# Patient Record
Sex: Female | Born: 1995
Health system: Southern US, Community
[De-identification: ages and names within clinical notes are randomized; demographics above are authoritative.]

## PROBLEM LIST (undated history)

## (undated) DIAGNOSIS — L709 Acne, unspecified: Secondary | ICD-10-CM

## (undated) DIAGNOSIS — R51 Headache: Secondary | ICD-10-CM

## (undated) DIAGNOSIS — E669 Obesity, unspecified: Secondary | ICD-10-CM

## (undated) DIAGNOSIS — F419 Anxiety disorder, unspecified: Secondary | ICD-10-CM

## (undated) HISTORY — DX: Obesity, unspecified: E66.9

## (undated) HISTORY — DX: Acne, unspecified: L70.9

---
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
## 2013-01-12 ENCOUNTER — Ambulatory Visit (INDEPENDENT_AMBULATORY_CARE_PROVIDER_SITE_OTHER): Payer: BC Managed Care – PPO | Admitting: Family Medicine

## 2013-01-12 ENCOUNTER — Encounter: Payer: Self-pay | Admitting: Family Medicine

## 2013-01-12 VITALS — BP 112/80 | HR 72 | Temp 97.9°F | Ht 66.25 in | Wt 198.0 lb

## 2013-01-12 DIAGNOSIS — R079 Chest pain, unspecified: Secondary | ICD-10-CM | POA: Insufficient documentation

## 2013-01-12 DIAGNOSIS — R21 Rash and other nonspecific skin eruption: Secondary | ICD-10-CM

## 2013-01-12 NOTE — Progress Notes (Signed)
  Subjective:    Patient ID: Diane Moss, female    DOB: November 07, 1995, 17 y.o.   MRN: [id number]  HPI  17 yo female here for:  1.  Rash- very itchy, on arms, legs and back x 2 weeks- on and off.  No lesions currently.  Benadryl does help.  No new soaps, detergents, foods or other known substances.    2.  CP- started last week.  No known injury.  Pain is worse with deep inspirations and intermittent.  Does sometimes radiate to her back.  No DOE or diaphoresis.  Patient Active Problem List   Diagnosis Date Noted  . Rash and nonspecific skin eruption 01/12/2013  . Chest pain 01/12/2013  . Abdominal pain, left lateral 06/05/2012  . Viral bronchitis 04/07/2012  . Back pain 03/25/2011  . Pituitary microadenoma 01/08/2011  . Routine general medical examination at a health care facility 01/04/2011  . Amenorrhea 01/04/2011  . CHILDHOOD OBESITY 02/20/2009  . AMENORRHEA, PRIMARY 02/20/2009   Past Medical History  Diagnosis Date  . Acne   . Obesity    No past surgical history on file. History  Substance Use Topics  . Smoking status: Never Smoker   . Smokeless tobacco: Not on file  . Alcohol Use: No   Family History  Problem Relation Age of Onset  . Cancer Mother 30    cervial and ovarian  . Diabetes Other   . Hyperlipidemia Other   . Hypertension Other    No Known Allergies Current Outpatient Prescriptions on File Prior to Visit  Medication Sig Dispense Refill  . NECON 10/11, 28, 35 MCG tablet TAKE 1 TABLET BY MOUTH ONCE A DAY  28 tablet  10   No current facility-administered medications on file prior to visit.   The PMH, PSH, Social History, Family History, Medications, and allergies have been reviewed in [facility], and have been updated if relevant.   Review of Systems See HPI    Objective:   Physical Exam BP 112/80  Pulse 72  Temp(Src) 97.9 F (36.6 C)  Ht 5' 6.25" (1.683 m)  Wt 198 lb (89.812 kg)  BMI 31.71 kg/m2  General:  Well-developed,well-nourished,in no acute  distress; alert,appropriate and cooperative throughout examination Head:  normocephalic and atraumatic.   Lungs:  Normal respiratory effort, chest expands symmetrically. Lungs are clear to auscultation, no crackles or wheezes. Heart:  Normal rate and regular rhythm. S1 and S2 normal without gallop, murmur, click, rub or other extra sounds. Abdomen:  Bowel sounds positive,abdomen soft and non-tender without masses, organomegaly or hernias noted. Msk:  No deformity or scoliosis noted of thoracic or lumbar spine.   Extremities:  No clubbing, cyanosis, edema, or deformity noted with normal full range of motion of all joints.   Neurologic:  alert & oriented X3 and gait normal.   Skin:  Intact without suspicious lesions or rashes Cervical Nodes:  No lymphadenopathy noted Axillary Nodes:  No palpable lymphadenopathy Psych:  Cognition and judgment appear intact. Alert and cooperative with normal attention span and concentration. No apparent delusions, illusions, hallucinations        Assessment & Plan:  1. Rash and nonspecific skin eruption Probable allergic dermatitis but no specific eruption on exam. Advised to continue to monitor- use prn benadryl and or topical hydrocortisone.  If persists, refer to allergist for testing.  2. Chest pain EKG reassuring- NSR.  I suspect this is costochondritis.  See AVS for supportive care. - EKG 12-Lead

## 2013-01-12 NOTE — Patient Instructions (Addendum)
Good to see you. Try to think of anything that may be new that you could be allergic to.  Use benadryl by mouth and or topical hydrocortisone cream as needed.  If this continues please call me and will send you to an allergic.  Costochondritis Costochondritis (Tietze syndrome), or costochondral separation, is a swelling and irritation (inflammation) of the tissue (cartilage) that connects your ribs with your breastbone (sternum). It may occur on its own (spontaneously), through damage caused by an accident (trauma), or simply from coughing or minor exercise. It may take up to 6 weeks to get better and longer if you are unable to be conservative in your activities. HOME CARE INSTRUCTIONS   Avoid exhausting physical activity. Try not to strain your ribs during normal activity. This would include any activities using chest, belly (abdominal), and side muscles, especially if heavy weights are used.  Use ice for 15-20 minutes per hour while awake for the first 2 days. Place the ice in a plastic bag, and place a towel between the bag of ice and your skin.  Only take over-the-counter or prescription medicines for pain, discomfort, or fever as directed by your caregiver. SEEK IMMEDIATE MEDICAL CARE IF:   Your pain increases or you are very uncomfortable.  You have a fever.  You develop difficulty with your breathing.  You cough up blood.  You develop worse chest pains, shortness of breath, sweating, or vomiting.  You develop new, unexplained problems (symptoms). MAKE SURE YOU:   Understand these instructions.  Will watch your condition.  Will get help right away if you are not doing well or get worse. Document Released: 02/17/2005 Document Revised: 08/02/2011 Document Reviewed: 12/27/2007 [facility] Patient Information 2014 [place], [place].

## 2013-01-23 ENCOUNTER — Ambulatory Visit: Payer: BC Managed Care – PPO | Admitting: Family Medicine

## 2013-01-23 ENCOUNTER — Encounter: Payer: Self-pay | Admitting: Internal Medicine

## 2013-01-23 ENCOUNTER — Ambulatory Visit (INDEPENDENT_AMBULATORY_CARE_PROVIDER_SITE_OTHER): Payer: BC Managed Care – PPO | Admitting: Internal Medicine

## 2013-01-23 VITALS — BP 106/78 | HR 96 | Temp 97.8°F | Wt 201.0 lb

## 2013-01-23 DIAGNOSIS — N760 Acute vaginitis: Secondary | ICD-10-CM

## 2013-01-23 DIAGNOSIS — R109 Unspecified abdominal pain: Secondary | ICD-10-CM

## 2013-01-23 DIAGNOSIS — A499 Bacterial infection, unspecified: Secondary | ICD-10-CM

## 2013-01-23 DIAGNOSIS — N390 Urinary tract infection, site not specified: Secondary | ICD-10-CM

## 2013-01-23 DIAGNOSIS — B9689 Other specified bacterial agents as the cause of diseases classified elsewhere: Secondary | ICD-10-CM

## 2013-01-23 LAB — POCT URINALYSIS DIPSTICK
Glucose, UA: NEGATIVE
Nitrite, UA: NEGATIVE
Protein, UA: 30
Urobilinogen, UA: 0.2

## 2013-01-23 MED ORDER — METRONIDAZOLE 0.75 % VA GEL: 1.0000 | Freq: Two times a day (BID) | VAGINAL | Status: DC

## 2013-01-23 MED ORDER — CIPROFLOXACIN HCL 500 MG PO TABS: 500.0000 mg | ORAL_TABLET | Freq: Two times a day (BID) | ORAL | Status: DC

## 2013-01-23 NOTE — Patient Instructions (Signed)
Bacterial Vaginosis Bacterial vaginosis (BV) is a vaginal infection where the normal balance of bacteria in the vagina is disrupted. The normal balance is then replaced by an overgrowth of certain bacteria. There are several different kinds of bacteria that can cause BV. BV is the most common vaginal infection in women of childbearing age. CAUSES   The cause of BV is not fully understood. BV develops when there is an increase or imbalance of harmful bacteria.  Some activities or behaviors can upset the normal balance of bacteria in the vagina and put women at increased risk including:  Having a new sex partner or multiple sex partners.  Douching.  Using an intrauterine device (IUD) for contraception.  It is not clear what role sexual activity plays in the development of BV. However, women that have never had sexual intercourse are rarely infected with BV. Women do not get BV from toilet seats, bedding, swimming pools or from touching objects around them.  SYMPTOMS   Grey vaginal discharge.  A fish-like odor with discharge, especially after sexual intercourse.  Itching or burning of the vagina and vulva.  Burning or pain with urination.  Some women have no signs or symptoms at all. DIAGNOSIS  Your caregiver must examine the vagina for signs of BV. Your caregiver will perform lab tests and look at the sample of vaginal fluid through a microscope. They will look for bacteria and abnormal cells (clue cells), a pH test higher than 4.5, and a positive amine test all associated with BV.  RISKS AND COMPLICATIONS   Pelvic inflammatory disease (PID).  Infections following gynecology surgery.  Developing HIV.  Developing herpes virus. TREATMENT  Sometimes BV will clear up without treatment. However, all women with symptoms of BV should be treated to avoid complications, especially if gynecology surgery is planned. Female partners generally do not need to be treated. However, BV may spread  between female sex partners so treatment is helpful in preventing a recurrence of BV.   BV may be treated with antibiotics. The antibiotics come in either pill or vaginal cream forms. Either can be used with nonpregnant or pregnant women, but the recommended dosages differ. These antibiotics are not harmful to the baby.  BV can recur after treatment. If this happens, a second round of antibiotics will often be prescribed.  Treatment is important for pregnant women. If not treated, BV can cause a premature delivery, especially for a pregnant woman who had a premature birth in the past. All pregnant women who have symptoms of BV should be checked and treated.  For chronic reoccurrence of BV, treatment with a type of prescribed gel vaginally twice a week is helpful. HOME CARE INSTRUCTIONS   Finish all medication as directed by your caregiver.  Do not have sex until treatment is completed.  Tell your sexual partner that you have a vaginal infection. They should see their caregiver and be treated if they have problems, such as a mild rash or itching.  Practice safe sex. Use condoms. Only have 1 sex partner. PREVENTION  Basic prevention steps can help reduce the risk of upsetting the natural balance of bacteria in the vagina and developing BV:  Do not have sexual intercourse (be abstinent).  Do not douche.  Use all of the medicine prescribed for treatment of BV, even if the signs and symptoms go away.  Tell your sex partner if you have BV. That way, they can be treated, if needed, to prevent reoccurrence. SEEK MEDICAL CARE IF:     Your symptoms are not improving after 3 days of treatment.  You have increased discharge, pain, or fever. MAKE SURE YOU:   Understand these instructions.  Will watch your condition.  Will get help right away if you are not doing well or get worse. FOR MORE INFORMATION  Division of STD Prevention (DSTDP), Centers for Disease Control and Prevention:  www.cdc.gov/std American Social Health Association (ASHA): www.ashastd.org  Document Released: 05/10/2005 Document Revised: 08/02/2011 Document Reviewed: 10/31/2008 [employer] Patient Information 2014 [employer].  

## 2013-01-23 NOTE — Progress Notes (Signed)
HPI  Pt presents to the clinic today with c/o left flank pain. This started this morning around 630 am. She also c/o some vaginal discharge. The discharge is thin and white. She reports that the discharge does have an odor. There is no itching or pain associated with the discharge. She has never had a discharge like this before. She has not taken anything OTC for this. Her LMP was 01/07/2013. She is not sexually active.   Review of Systems  Past Medical History  Diagnosis Date  . Acne   . Obesity     Family History  Problem Relation Age of Onset  . Cancer Mother 30    cervial and ovarian  . Diabetes Other   . Hyperlipidemia Other   . Hypertension Other     History   Social History  . Marital Status: Single    Spouse Name: N/A    Number of Children: N/A  . Years of Education: N/A   Occupational History  . Not on file.   Social History Main Topics  . Smoking status: Never Smoker   . Smokeless tobacco: Not on file  . Alcohol Use: No  . Drug Use: No  . Sexual Activity: Not on file   Other Topics Concern  . Not on file   Social History Narrative   Lives with mom and brother.  Parents are divorced but dad is still involved in her life.  He lives close by.  Never been sexually active.  Wants to be a Runner, broadcasting/film/video and a Environmental manager.    No Known Allergies  Constitutional: Denies fever, malaise, fatigue, headache or abrupt weight changes.   GU: Pt reports left flank pain and vaginal discharge.. Denies burning sensation, blood in urine,urgency, frequency or dysuria. Skin: Denies redness, rashes, lesions or ulcercations.   No other specific complaints in a complete review of systems (except as listed in HPI above).    Objective:   Physical Exam  BP 106/78  Pulse 96  Temp(Src) 97.8 F (36.6 C) (Oral)  Wt 201 lb (91.173 kg)  BMI 32.19 kg/m2  SpO2 100%  LMP 01/01/2013 Wt Readings from Last 3 Encounters:  01/23/13 201 lb (91.173 kg) (97%*, Z = 1.96)  01/12/13 198 lb  (89.812 kg) (97%*, Z = 1.91)  06/05/12 197 lb (89.359 kg) (97%*, Z = 1.91)   * Growth percentiles are based on CDC 2-20 Years data.    General: Appears her stated age, well developed, well nourished in NAD. Cardiovascular: Normal rate and rhythm. S1,S2 noted.  No murmur, rubs or gallops noted. No JVD or BLE edema. No carotid bruits noted. Pulmonary/Chest: Normal effort and positive vesicular breath sounds. No respiratory distress. No wheezes, rales or ronchi noted.  Abdomen: Soft and nontender. Normal bowel sounds, no bruits noted. No distention or masses noted. Liver, spleen and kidneys non palpable. Tender to palpation over the bladder area. No CVA tenderness.      Assessment & Plan:   Left flank pain secondary to UTI, new onset:  Urinalysis and urine culture: + blood and leuks Wet prep done by provider: + clue cells, no yeast or trich Will treat with Cipro 500 mg BID x 5 days Will treat with Metrogel 0.75% vaginal suppositories BID x 5 days Drink plenty of fluids  Bacterial Vaginosis, new onset:  Wet prep done by provider: + clue cells, no yeast or trich Will treat with Metrogel 0.75% vaginal suppositories BID x 5 days  RTC as needed or if symptoms persist.

## 2013-02-06 ENCOUNTER — Other Ambulatory Visit: Payer: Self-pay | Admitting: Family Medicine

## 2013-02-06 DIAGNOSIS — Z0000 Encounter for general adult medical examination without abnormal findings: Secondary | ICD-10-CM

## 2013-02-06 DIAGNOSIS — Z136 Encounter for screening for cardiovascular disorders: Secondary | ICD-10-CM

## 2013-02-12 ENCOUNTER — Other Ambulatory Visit (INDEPENDENT_AMBULATORY_CARE_PROVIDER_SITE_OTHER): Payer: BC Managed Care – PPO

## 2013-02-12 DIAGNOSIS — Z136 Encounter for screening for cardiovascular disorders: Secondary | ICD-10-CM

## 2013-02-12 DIAGNOSIS — Z0000 Encounter for general adult medical examination without abnormal findings: Secondary | ICD-10-CM

## 2013-02-12 LAB — LIPID PANEL
LDL Cholesterol: 84 mg/dL (ref 0–99)
Triglycerides: 117 mg/dL (ref 0.0–149.0)
VLDL: 23.4 mg/dL (ref 0.0–40.0)

## 2013-02-12 LAB — CBC WITH DIFFERENTIAL/PLATELET
Basophils Absolute: 0.1 10*3/uL (ref 0.0–0.1)
Eosinophils Absolute: 0.2 10*3/uL (ref 0.0–0.7)
Eosinophils Relative: 2.6 % (ref 0.0–5.0)
HCT: 36.8 % (ref 36.0–46.0)
Hemoglobin: 12.4 g/dL (ref 12.0–15.0)
Lymphocytes Relative: 48.1 % — ABNORMAL HIGH (ref 12.0–46.0)
Lymphs Abs: 3.7 10*3/uL (ref 0.7–4.0)
MCV: 86.3 fl (ref 78.0–100.0)
Monocytes Absolute: 0.5 10*3/uL (ref 0.1–1.0)
Monocytes Relative: 7.1 % (ref 3.0–12.0)
Neutro Abs: 3.2 10*3/uL (ref 1.4–7.7)
RDW: 12.7 % (ref 11.5–14.6)

## 2013-02-12 LAB — COMPREHENSIVE METABOLIC PANEL
AST: 15 U/L (ref 0–37)
Albumin: 3.7 g/dL (ref 3.5–5.2)
Alkaline Phosphatase: 43 U/L (ref 39–117)
Potassium: 4 mEq/L (ref 3.5–5.1)
Sodium: 140 mEq/L (ref 135–145)
Total Bilirubin: 0.5 mg/dL (ref 0.3–1.2)
Total Protein: 6.8 g/dL (ref 6.0–8.3)

## 2013-02-12 LAB — TSH: TSH: 3.31 u[IU]/mL (ref 0.35–5.50)

## 2013-02-16 ENCOUNTER — Ambulatory Visit (INDEPENDENT_AMBULATORY_CARE_PROVIDER_SITE_OTHER): Payer: BC Managed Care – PPO | Admitting: Family Medicine

## 2013-02-16 ENCOUNTER — Encounter: Payer: Self-pay | Admitting: Family Medicine

## 2013-02-16 VITALS — BP 130/70 | HR 88 | Temp 98.0°F | Ht 66.0 in | Wt 198.8 lb

## 2013-02-16 DIAGNOSIS — F329 Major depressive disorder, single episode, unspecified: Secondary | ICD-10-CM

## 2013-02-16 DIAGNOSIS — M549 Dorsalgia, unspecified: Secondary | ICD-10-CM

## 2013-02-16 DIAGNOSIS — F3289 Other specified depressive episodes: Secondary | ICD-10-CM

## 2013-02-16 DIAGNOSIS — N912 Amenorrhea, unspecified: Secondary | ICD-10-CM

## 2013-02-16 DIAGNOSIS — Z0000 Encounter for general adult medical examination without abnormal findings: Secondary | ICD-10-CM

## 2013-02-16 DIAGNOSIS — D352 Benign neoplasm of pituitary gland: Secondary | ICD-10-CM

## 2013-02-16 DIAGNOSIS — F32A Depression, unspecified: Secondary | ICD-10-CM | POA: Insufficient documentation

## 2013-02-16 MED ORDER — NORETHIN-ETH ESTRAD BIPHASIC 0.5-35/1-35 MG-MCG PO TABS: ORAL_TABLET | ORAL | Status: DC

## 2013-02-16 MED ORDER — FLUOXETINE HCL 20 MG PO TABS: ORAL_TABLET | ORAL | Status: DC

## 2013-02-16 NOTE — Progress Notes (Signed)
17 yo pleasant female here for CPX.  Attending [employer].  Wants to go into elementary education.    Does have intermittent back pain.  No dysuria but wants to make sure she does not have a UTI.  Treated for UTI earlier this month (saw Nicki Reaper).  No fever, nausea or vomiting.  Microademona-  MRI in 12/2010 consistent with 2 mm microadenoma. TSH, a1c, prolactin, FSH, LH wnl.  Referred to Neuro, recommended endocrinology referral.  Referred to Dr. Tedd Sias who recommended 6 month follow up MRI.  Destany did not have it done due to her braces.  She is getting braces off in 2-3 months and plans to get MRI.  Since starting OCPs, Rayneisha feels better.  Has had regular periods all year. Headaches resolved.  No visual changes or other focal neurological symptoms.  ? Depression/anxiety- Past year, since she has been in school, increased anxiety and depression.  Feels tired and does not want to hang out with friends as much.  Also gets anxious when she has big homework assignments.  No panic attacks.  No SI or HI.  Has never taken an SSRI. Sleeps ok.  Feels appetite is good.  Lab Results  Component Value Date   TSH 3.31 02/12/2013   Lab Results  Component Value Date   CHOL 156 02/12/2013   HDL 48.30 02/12/2013   LDLCALC 84 02/12/2013   TRIG 117.0 02/12/2013   CHOLHDL 3 02/12/2013     Obesity- weight stable.  Gets most of her exercise now walking between classes on campus.  Wt Readings from Last 3 Encounters:  02/16/13 198 lb 12 oz (90.152 kg) (97%*, Z = 1.92)  01/23/13 201 lb (91.173 kg) (97%*, Z = 1.96)  01/12/13 198 lb (89.812 kg) (97%*, Z = 1.91)   * Growth percentiles are based on CDC 2-20 Years data.         Patient Active Problem List   Diagnosis Date Noted  . Abdominal pain, left lateral 06/05/2012  . Back pain 03/25/2011  . Pituitary microadenoma 01/08/2011  . Routine general medical examination at a health care facility 01/04/2011  . Amenorrhea 01/04/2011  . CHILDHOOD  OBESITY 02/20/2009   Past Medical History  Diagnosis Date  . Acne   . Obesity    No past surgical history on file. History  Substance Use Topics  . Smoking status: Never Smoker   . Smokeless tobacco: Not on file  . Alcohol Use: No   Family History  Problem Relation Age of Onset  . Cancer Mother 30    cervial and ovarian  . Diabetes Other   . Hyperlipidemia Other   . Hypertension Other    No Known Allergies Current Outpatient Prescriptions on File Prior to Visit  Medication Sig Dispense Refill  . NECON 10/11, 28, 35 MCG tablet TAKE 1 TABLET BY MOUTH ONCE A DAY  28 tablet  10   No current facility-administered medications on file prior to visit.   The PMH, PSH, Social History, Family History, Medications, and allergies have been reviewed in [facility], and have been updated if relevant.  Review of Systems       See HPI CV:  Denies chest pains and dyspnea on exertion. Resp:  Denies wheezing. GI:  Denies nausea, vomiting, and diarrhea. Heme:  Denies abnormal bruising and bleeding.  Physical Exam BP 130/70  Pulse 88  Temp(Src) 98 F (36.7 C) (Oral)  Ht 5\' 6"  (1.676 m)  Wt 198 lb 12 oz (90.152 kg)  BMI  32.09 kg/m2  LMP 01/24/2013   General:  Well-developed,well-nourished,in no acute distress; alert,appropriate and cooperative throughout examination Head:  normocephalic and atraumatic.   Eyes:  vision grossly intact, pupils equal, pupils round, and pupils reactive to light.   Ears:  R ear normal and L ear normal.   Nose:  no external deformity.   Mouth:  good dentition.   Neck:  No deformities, masses, or tenderness noted. Lungs:  Normal respiratory effort, chest expands symmetrically. Lungs are clear to auscultation, no crackles or wheezes. Heart:  Normal rate and regular rhythm. S1 and S2 normal without gallop, murmur, click, rub or other extra sounds. Abdomen:  Bowel sounds positive,abdomen soft and non-tender without masses, organomegaly or hernias noted. Msk:  No  deformity or scoliosis noted of thoracic or lumbar spine.   Extremities:  No clubbing, cyanosis, edema, or deformity noted with normal full range of motion of all joints.   Neurologic:  alert & oriented X3 and gait normal.   Skin:  Intact without suspicious lesions or rashes Cervical Nodes:  No lymphadenopathy noted Axillary Nodes:  No palpable lymphadenopathy Psych:  Cognition and judgment appear intact. Alert and cooperative with normal attention span and concentration. No apparent delusions, illusions, hallucinations   Assessment and Plan:  1. Pituitary microadenoma Strongly urged her to get MRI to assure stability as soon as she gets her braces off. The patient indicates understanding of these issues and agrees with the plan.   2. Routine general medical examination at a health care facility Reviewed preventive care protocols, scheduled due services, and updated immunizations Discussed nutrition, exercise, diet, and healthy lifestyle. She declines flu shot.  3. Amenorrhea Resolved with OCPs.  4. Back pain Unable to leave urine sample.  5. Depression With anxiety. Discussed tx options. Not currently interested in psychotherapy.  She would like to try SSRI- will start Prozac 10 mg daily, titrate up to 20 mg daily after one week. Discussed possible side effects. She will call me in a few weeks with an update. The patient indicates understanding of these issues and agrees with the plan.

## 2013-02-16 NOTE — Patient Instructions (Addendum)
It was great to see you. We are starting Prozac 20 mg daily- you will take 1/2 (10 mg daily) for the first week.  It may be best to take this in the evening or before bedtime.  Call me in a few weeks with an update.  Please bring by your urine sample at your convenience.  Make sure you schedule your MRI once you have your braces removed.

## 2013-08-01 ENCOUNTER — Other Ambulatory Visit: Payer: Self-pay | Admitting: Family Medicine

## 2013-08-02 NOTE — Telephone Encounter (Signed)
Pt requesting medication refill. Last ov 01/2013 with no future appts scheduled. pls advise. 

## 2013-08-27 ENCOUNTER — Ambulatory Visit (INDEPENDENT_AMBULATORY_CARE_PROVIDER_SITE_OTHER): Payer: BC Managed Care – PPO | Admitting: Family Medicine

## 2013-08-27 ENCOUNTER — Encounter: Payer: Self-pay | Admitting: Family Medicine

## 2013-08-27 VITALS — BP 128/70 | HR 82 | Temp 98.2°F | Ht 66.0 in | Wt 189.8 lb

## 2013-08-27 DIAGNOSIS — J069 Acute upper respiratory infection, unspecified: Secondary | ICD-10-CM

## 2013-08-27 MED ORDER — AMOXICILLIN-POT CLAVULANATE 875-125 MG PO TABS: 1.0000 | ORAL_TABLET | Freq: Two times a day (BID) | ORAL | Status: AC

## 2013-08-27 NOTE — Progress Notes (Signed)
Pre visit review using our clinic review tool, if applicable. No additional management support is needed unless otherwise documented below in the visit note. 

## 2013-08-27 NOTE — Progress Notes (Signed)
SUBJECTIVE:  Diane Moss is a 18 y.o. female who complains of coryza, congestion, sneezing, sore throat, bilateral sinus pain and fever for 10 days. She denies a history of anorexia, fatigue, shortness of breath, sweats, weakness, weight loss and wheezing and denies a history of asthma. Patient denies smoke cigarettes.   Patient Active Problem List   Diagnosis Date Noted  . Depression 02/16/2013  . Abdominal pain, left lateral 06/05/2012  . Back pain 03/25/2011  . Pituitary microadenoma 01/08/2011  . Routine general medical examination at a health care facility 01/04/2011  . Amenorrhea 01/04/2011  . CHILDHOOD OBESITY 02/20/2009   Past Medical History  Diagnosis Date  . Acne   . Obesity    No past surgical history on file. History  Substance Use Topics  . Smoking status: Never Smoker   . Smokeless tobacco: Not on file  . Alcohol Use: No   Family History  Problem Relation Age of Onset  . Cancer Mother 30    cervial and ovarian  . Diabetes Other   . Hyperlipidemia Other   . Hypertension Other    No Known Allergies Current Outpatient Prescriptions on File Prior to Visit  Medication Sig Dispense Refill  . FLUoxetine (PROZAC) 20 MG tablet 1/2 TABLET BY MOUTH EVERY EVENING FOR 1 WEEK, THEN INCREASE TO 1 TABLET BY MOUTH EVERY EVENING.  30 tablet  3  . Norethindrone-Ethinyl Estradiol Biphasic (NECON 10/11, 28,) 0.5-35/1-35 MG-MCG tablet TAKE 1 TABLET BY MOUTH ONCE A DAY  28 tablet  10   No current facility-administered medications on file prior to visit.   The PMH, PSH, Social History, Family History, Medications, and allergies have been reviewed in [facility], and have been updated if relevant.  OBJECTIVE: BP 128/70  Pulse 82  Temp(Src) 98.2 F (36.8 C) (Oral)  Ht 5\' 6"  (1.676 m)  Wt 189 lb 12 oz (86.07 kg)  BMI 30.64 kg/m2  SpO2 97%  LMP 08/15/2013  She appears well, vital signs are as noted. Ears normal.  Throat and pharynx normal.  Neck supple. No adenopathy in the neck.  Nose is congested. Sinuses tender, right > left. The chest is clear, without wheezes or rales.  ASSESSMENT:  sinusitis  PLAN: Given duration and progression of symptoms, will treat for bacterial sinusitis with Augmentin.  Symptomatic therapy suggested: push fluids, rest and return office visit prn if symptoms persist or worsen.    Call or return to clinic prn if these symptoms worsen or fail to improve as anticipated.

## 2013-08-27 NOTE — Patient Instructions (Signed)
Take Augmentin as directed- 1 tablet twice daily for 10 days.  Drink lots of fluids.    Treat sympotmatically with Mucinex, nasal saline irrigation, and Tylenol/Ibuprofen.   Also try an antihistamine/decongestant like claritin D or Allegra D over the counter- two times a day as needed ( have to sign for them at pharmacy).   Try over the counter nasocort-start with 2 sprays per nostril per day...and then try to taper to 1 spray per nostril once symptoms improve.   You can use warm compresses.  Cough suppressant at night.   Call if not improving as expected in 5-7 days.

## 2013-09-26 ENCOUNTER — Telehealth: Payer: Self-pay

## 2013-09-26 NOTE — Telephone Encounter (Signed)
Pt called to schedule TB skin test; pt said she was going to cb to schedule appt to see Dr Deborra Medina; advised could come for nurse visit or could have TB skin test done at office visit if pt wanted; but advised cannot get TB skin test on Thursday. Pt voiced understanding and will cb to schedule appt.

## 2013-09-27 ENCOUNTER — Ambulatory Visit (INDEPENDENT_AMBULATORY_CARE_PROVIDER_SITE_OTHER): Payer: BC Managed Care – PPO | Admitting: Internal Medicine

## 2013-09-27 ENCOUNTER — Encounter: Payer: Self-pay | Admitting: Internal Medicine

## 2013-09-27 VITALS — BP 112/68 | HR 82 | Temp 98.4°F | Wt 192.0 lb

## 2013-09-27 DIAGNOSIS — R109 Unspecified abdominal pain: Secondary | ICD-10-CM

## 2013-09-27 DIAGNOSIS — N390 Urinary tract infection, site not specified: Secondary | ICD-10-CM

## 2013-09-27 LAB — POCT URINALYSIS DIPSTICK
Bilirubin, UA: NEGATIVE
GLUCOSE UA: NEGATIVE
Ketones, UA: NEGATIVE
NITRITE UA: NEGATIVE
SPEC GRAV UA: 1.015
Urobilinogen, UA: 0.2
pH, UA: 6

## 2013-09-27 MED ORDER — NITROFURANTOIN MONOHYD MACRO 100 MG PO CAPS: 100.0000 mg | ORAL_CAPSULE | Freq: Two times a day (BID) | ORAL | Status: DC

## 2013-09-27 NOTE — Progress Notes (Signed)
HPI  Pt presents to the clinic today with c/o bladder pressure and pelvic discomfort. She reports this started 1 day ago. She has noticed a small amount of blood when she wipes but denies fever, chills, nausea or back pain. She is not sexually active. She is on birth control. Her LMP was 2 weeks ago. She has been drinking cranberry juice and water, also taking Ibuprofen for the pain. She has had a UTI in the past and reports it presented a similar way.   Review of Systems  Past Medical History  Diagnosis Date  . Acne   . Obesity     Family History  Problem Relation Age of Onset  . Cancer Mother 30    cervial and ovarian  . Diabetes Other   . Hyperlipidemia Other   . Hypertension Other     History   Social History  . Marital Status: Single    Spouse Name: N/A    Number of Children: N/A  . Years of Education: N/A   Occupational History  . Not on file.   Social History Main Topics  . Smoking status: Never Smoker   . Smokeless tobacco: Not on file  . Alcohol Use: No  . Drug Use: No  . Sexual Activity: Not on file   Other Topics Concern  . Not on file   Social History Narrative   Lives with mom and brother.  Parents are divorced but dad is still involved in her life.  He lives close by.  Never been sexually active.  Wants to be a Pharmacist, hospital and a Geophysicist/field seismologist.    No Known Allergies  Constitutional: Denies fever, malaise, fatigue, headache or abrupt weight changes.   GU: Pt reports urgency, frequency and pain with urination. Denies burning sensation,  odor or discharge. Skin: Denies redness, rashes, lesions or ulcercations.   No other specific complaints in a complete review of systems (except as listed in HPI above).    Objective:   Physical Exam  BP 112/68  Pulse 82  Temp(Src) 98.4 F (36.9 C) (Oral)  Wt 192 lb (87.091 kg)  SpO2 98%  LMP 09/10/2013 Wt Readings from Last 3 Encounters:  09/27/13 192 lb (87.091 kg)  08/27/13 189 lb 12 oz (86.07 kg)  02/16/13  198 lb 12 oz (90.152 kg) (97%*, Z = 1.92)   * Growth percentiles are based on CDC 2-20 Years data.    General: Appears her stated age, well developed, well nourished in NAD. Cardiovascular: Normal rate and rhythm. S1,S2 noted.  No murmur, rubs or gallops noted. No JVD or BLE edema. No carotid bruits noted. Pulmonary/Chest: Normal effort and positive vesicular breath sounds. No respiratory distress. No wheezes, rales or ronchi noted.  Abdomen: Soft and nontender. Normal bowel sounds, no bruits noted. No distention or masses noted. Liver, spleen and kidneys non palpable. Tender to palpation over the bladder area. No CVA tenderness.      Assessment & Plan:   Urgency, Frequency, Dysuria secondary to UTI  Urinalysis: large blood, small leuks Will send for culture eRx sent if for Macrobid 100 mg BID x 5 days OK to take AZO OTC Drink plenty of fluids  RTC as needed or if symptoms persist.

## 2013-09-27 NOTE — Addendum Note (Signed)
Addended by: Lurlean Nanny on: 09/27/2013 01:32 PM   Modules accepted: Orders

## 2013-09-27 NOTE — Progress Notes (Signed)
Pre visit review using our clinic review tool, if applicable. No additional management support is needed unless otherwise documented below in the visit note. 

## 2013-09-27 NOTE — Patient Instructions (Addendum)
Urinary Tract Infection  Urinary tract infections (UTIs) can develop anywhere along your urinary tract. Your urinary tract is your body's drainage system for removing wastes and extra water. Your urinary tract includes two kidneys, two ureters, a bladder, and a urethra. Your kidneys are a pair of bean-shaped organs. Each kidney is about the size of your fist. They are located below your ribs, one on each side of your spine.  CAUSES  Infections are caused by microbes, which are microscopic organisms, including fungi, viruses, and bacteria. These organisms are so small that they can only be seen through a microscope. Bacteria are the microbes that most commonly cause UTIs.  SYMPTOMS   Symptoms of UTIs may vary by age and gender of the patient and by the location of the infection. Symptoms in young women typically include a frequent and intense urge to urinate and a painful, burning feeling in the bladder or urethra during urination. Older women and men are more likely to be tired, shaky, and weak and have muscle aches and abdominal pain. A fever may mean the infection is in your kidneys. Other symptoms of a kidney infection include pain in your back or sides below the ribs, nausea, and vomiting.  DIAGNOSIS  To diagnose a UTI, your caregiver will ask you about your symptoms. Your caregiver also will ask to provide a urine sample. The urine sample will be tested for bacteria and white blood cells. White blood cells are made by your body to help fight infection.  TREATMENT   Typically, UTIs can be treated with medication. Because most UTIs are caused by a bacterial infection, they usually can be treated with the use of antibiotics. The choice of antibiotic and length of treatment depend on your symptoms and the type of bacteria causing your infection.  HOME CARE INSTRUCTIONS   If you were prescribed antibiotics, take them exactly as your caregiver instructs you. Finish the medication even if you feel better after you  have only taken some of the medication.   Drink enough water and fluids to keep your urine clear or pale yellow.   Avoid caffeine, tea, and carbonated beverages. They tend to irritate your bladder.   Empty your bladder often. Avoid holding urine for long periods of time.   Empty your bladder before and after sexual intercourse.   After a bowel movement, women should cleanse from front to back. Use each tissue only once.  SEEK MEDICAL CARE IF:    You have back pain.   You develop a fever.   Your symptoms do not begin to resolve within 3 days.  SEEK IMMEDIATE MEDICAL CARE IF:    You have severe back pain or lower abdominal pain.   You develop chills.   You have nausea or vomiting.   You have continued burning or discomfort with urination.  MAKE SURE YOU:    Understand these instructions.   Will watch your condition.   Will get help right away if you are not doing well or get worse.  Document Released: 02/17/2005 Document Revised: 11/09/2011 Document Reviewed: 06/18/2011  ExitCare Patient Information 2014 [employer].

## 2013-09-29 LAB — URINE CULTURE
Colony Count: NO GROWTH
ORGANISM ID, BACTERIA: NO GROWTH

## 2013-10-02 ENCOUNTER — Encounter: Payer: Self-pay | Admitting: Internal Medicine

## 2013-10-02 ENCOUNTER — Ambulatory Visit (INDEPENDENT_AMBULATORY_CARE_PROVIDER_SITE_OTHER): Payer: BC Managed Care – PPO | Admitting: Internal Medicine

## 2013-10-02 VITALS — BP 118/74 | HR 70 | Temp 98.6°F | Wt 192.0 lb

## 2013-10-02 DIAGNOSIS — R109 Unspecified abdominal pain: Secondary | ICD-10-CM

## 2013-10-02 DIAGNOSIS — N920 Excessive and frequent menstruation with regular cycle: Secondary | ICD-10-CM

## 2013-10-02 NOTE — Progress Notes (Signed)
Pre visit review using our clinic review tool, if applicable. No additional management support is needed unless otherwise documented below in the visit note. 

## 2013-10-02 NOTE — Patient Instructions (Addendum)

## 2013-10-02 NOTE — Progress Notes (Signed)
Subjective:    Patient ID: Diane Moss, female    DOB: May 08, 1996, 18 y.o.   MRN: [id number]  HPI  Pt presents to the clinic today to f/u UTI and flank pain. She reports some improvement with the abx. She continues to have the flank pain. She finishes up the [place] today. Urine culture from 5/7 showed no growth. She denies fever, chills or body aches. She does reports being on her period on and off for the last 3 weeks. She is on OCP and has noticed her periods are more irregular. She is not sexually active.  Review of Systems      Past Medical History  Diagnosis Date  . Acne   . Obesity     Current Outpatient Prescriptions  Medication Sig Dispense Refill  . fexofenadine-pseudoephedrine (ALLEGRA-D) 60-120 MG per tablet Take 1 tablet by mouth 2 (two) times daily.      Marland Kitchen FLUoxetine (PROZAC) 20 MG tablet 1/2 TABLET BY MOUTH EVERY EVENING FOR 1 WEEK, THEN INCREASE TO 1 TABLET BY MOUTH EVERY EVENING.  30 tablet  3  . Norethindrone-Ethinyl Estradiol Biphasic (NECON 10/11, 28,) 0.5-35/1-35 MG-MCG tablet TAKE 1 TABLET BY MOUTH ONCE A DAY  28 tablet  10   No current facility-administered medications for this visit.    No Known Allergies  Family History  Problem Relation Age of Onset  . Cancer Mother 30    cervial and ovarian  . Diabetes Other   . Hyperlipidemia Other   . Hypertension Other     History   Social History  . Marital Status: Single    Spouse Name: N/A    Number of Children: N/A  . Years of Education: N/A   Occupational History  . Not on file.   Social History Main Topics  . Smoking status: Never Smoker   . Smokeless tobacco: Not on file  . Alcohol Use: No  . Drug Use: No  . Sexual Activity: Not on file   Other Topics Concern  . Not on file   Social History Narrative   Lives with mom and brother.  Parents are divorced but dad is still involved in her life.  He lives close by.  Never been sexually active.  Wants to be a Pharmacist, hospital and a Geophysicist/field seismologist.      Constitutional: Denies fever, malaise, fatigue, headache or abrupt weight changes.  Gastrointestinal: Pt reports pelvic pain. Denies abdominal pain, bloating, constipation, diarrhea or blood in the stool.  GU: Pt reports prolonged bleeding. Denies urgency, frequency, pain with urination, burning sensation, blood in urine, odor or discharge.  No other specific complaints in a complete review of systems (except as listed in HPI above).  Objective:   Physical Exam   BP 118/74  Pulse 70  Temp(Src) 98.6 F (37 C) (Oral)  Wt 192 lb (87.091 kg)  LMP 09/10/2013 Wt Readings from Last 3 Encounters:  10/02/13 192 lb (87.091 kg)  09/27/13 192 lb (87.091 kg)  08/27/13 189 lb 12 oz (86.07 kg)    General: Appears her stated age, well developed, well nourished in NAD. Cardiovascular: Normal rate and rhythm. S1,S2 noted.  No murmur, rubs or gallops noted. No JVD or BLE edema. No carotid bruits noted. Pulmonary/Chest: Normal effort and positive vesicular breath sounds. No respiratory distress. No wheezes, rales or ronchi noted.  Abdomen: Soft and tender in the LLQ. Normal bowel sounds, no bruits noted. No distention or masses noted. Liver, spleen and kidneys non palpable. No CVA tenderness noted,  BMET    Component Value Date/Time   NA 140 02/12/2013 0759   K 4.0 02/12/2013 0759   CL 109 02/12/2013 0759   CO2 26 02/12/2013 0759   GLUCOSE 88 02/12/2013 0759   BUN 12 02/12/2013 0759   CREATININE 0.8 02/12/2013 0759   CALCIUM 9.0 02/12/2013 0759    Lipid Panel     Component Value Date/Time   CHOL 156 02/12/2013 0759   TRIG 117.0 02/12/2013 0759   HDL 48.30 02/12/2013 0759   CHOLHDL 3 02/12/2013 0759   VLDL 23.4 02/12/2013 0759   LDLCALC 84 02/12/2013 0759    CBC    Component Value Date/Time   WBC 7.7 02/12/2013 0759   RBC 4.27 02/12/2013 0759   HGB 12.4 02/12/2013 0759   HCT 36.8 02/12/2013 0759   PLT 321.0 02/12/2013 0759   MCV 86.3 02/12/2013 0759   MCHC 33.8 02/12/2013 0759   RDW 12.7  02/12/2013 0759   LYMPHSABS 3.7 02/12/2013 0759   MONOABS 0.5 02/12/2013 0759   EOSABS 0.2 02/12/2013 0759   BASOSABS 0.1 02/12/2013 0759    Hgb A1C Lab Results  Component Value Date   HGBA1C 5.4 01/04/2011        Assessment & Plan:   Left flank pain and LLQ with menorrhagia:  Will check transvaginal ultrasound and pelvic ultrasound to r/o cyst/fibroid If normal, will have you follow up with your PCP  Will be in touch with you after I receive the ultrasound reports.

## 2013-10-03 ENCOUNTER — Telehealth: Payer: Self-pay | Admitting: *Deleted

## 2013-10-03 NOTE — Telephone Encounter (Signed)
Pt dropped off "staff medical report" to be completed. Paperwork completed and pts mother contacted and informed it is available for pickup at the front desk

## 2013-10-05 ENCOUNTER — Ambulatory Visit: Payer: BC Managed Care – PPO

## 2013-10-05 ENCOUNTER — Ambulatory Visit
Admission: RE | Admit: 2013-10-05 | Discharge: 2013-10-05 | Disposition: A | Discharge status: Home / Self Care | Payer: BC Managed Care – PPO | Source: Ambulatory Visit | Attending: Internal Medicine | Admitting: Internal Medicine

## 2013-10-05 DIAGNOSIS — N920 Excessive and frequent menstruation with regular cycle: Secondary | ICD-10-CM

## 2013-10-05 DIAGNOSIS — R109 Unspecified abdominal pain: Secondary | ICD-10-CM

## 2013-10-17 ENCOUNTER — Telehealth: Payer: Self-pay

## 2013-10-17 NOTE — Telephone Encounter (Signed)
Spoke to pts mother who states that she will have pt fax paperwork to be completed again; will be faxed back to pts job after completion

## 2013-10-17 NOTE — Telephone Encounter (Signed)
The patient called hoping to track down her physical paperwork that was dropped off.  She was hoping it would be mailed to her, but she stated she has not received it in the mail yet.   Thanks!

## 2013-10-22 ENCOUNTER — Encounter: Payer: Self-pay | Admitting: Family Medicine

## 2013-10-22 ENCOUNTER — Ambulatory Visit: Payer: Self-pay | Admitting: Family Medicine

## 2013-10-22 ENCOUNTER — Telehealth: Payer: Self-pay

## 2013-10-22 ENCOUNTER — Other Ambulatory Visit: Payer: Self-pay | Admitting: Urology

## 2013-10-22 ENCOUNTER — Ambulatory Visit (INDEPENDENT_AMBULATORY_CARE_PROVIDER_SITE_OTHER): Payer: BC Managed Care – PPO | Admitting: Family Medicine

## 2013-10-22 VITALS — BP 124/68 | HR 69 | Temp 97.8°F | Ht 66.0 in | Wt 195.0 lb

## 2013-10-22 DIAGNOSIS — N201 Calculus of ureter: Secondary | ICD-10-CM

## 2013-10-22 DIAGNOSIS — R319 Hematuria, unspecified: Secondary | ICD-10-CM

## 2013-10-22 DIAGNOSIS — R109 Unspecified abdominal pain: Secondary | ICD-10-CM | POA: Insufficient documentation

## 2013-10-22 DIAGNOSIS — N1330 Unspecified hydronephrosis: Secondary | ICD-10-CM

## 2013-10-22 DIAGNOSIS — M549 Dorsalgia, unspecified: Secondary | ICD-10-CM

## 2013-10-22 DIAGNOSIS — N132 Hydronephrosis with renal and ureteral calculous obstruction: Secondary | ICD-10-CM

## 2013-10-22 MED ORDER — TAMSULOSIN HCL 0.4 MG PO CAPS: 0.4000 mg | ORAL_CAPSULE | Freq: Every day | ORAL | Status: DC

## 2013-10-22 MED ORDER — HYDROCODONE-ACETAMINOPHEN 5-325 MG PO TABS: 1.0000 | ORAL_TABLET | Freq: Four times a day (QID) | ORAL | Status: DC | PRN

## 2013-10-22 NOTE — Telephone Encounter (Signed)
[name] pts mother spoke with pt and Diane Moss understands that pt has a large kidney stone and [name] request cb today by Dr Deborra Medina to discuss what is plan of care; [name] request cb [phone].

## 2013-10-22 NOTE — Telephone Encounter (Signed)
Ok to write note as pt requests.

## 2013-10-22 NOTE — Telephone Encounter (Signed)
Pt informed of results.  Has norco, I have also sent in rx for fosamax and placed STAT urology referral.  She will await a call from [place] or Maria Antonia.

## 2013-10-22 NOTE — Progress Notes (Signed)
Subjective:   Patient ID: Diane Moss, female    DOB: 21-Jun-1995, 18 y.o.   MRN: [id number]  Amirah Goerke is a pleasant 18 y.o. year old female who presents to clinic today with Flank Pain and Back Pain  on 10/22/2013  HPI: Here for left sided back pain.  Has seen Webb Silversmith, NP twice for this.  Notes reviewed- initially seen on 5/7- treated for UTI with macrobid. UA pos for large blood. Urine cx showed no growth.  Pain did not improve, came back on the 12th to see [facility]. Abdomen ultrasound (not transvaginal) was done- negative.  Pain resolved on its own for two weeks and came back last night- same last night.  Same location, constant- says its a 10/10. Heating pad no longer helping.  No radiculopathy. No dysuria. LMP May 20th- not abnormally heavy or painful.  +nausea, no vomiting. No diarrhea. + dizziness  No family h/o kidney stones. Never been sexually active.  Current Outpatient Prescriptions on File Prior to Visit  Medication Sig Dispense Refill  . fexofenadine-pseudoephedrine (ALLEGRA-D) 60-120 MG per tablet Take 1 tablet by mouth 2 (two) times daily.      Marland Kitchen FLUoxetine (PROZAC) 20 MG tablet 1/2 TABLET BY MOUTH EVERY EVENING FOR 1 WEEK, THEN INCREASE TO 1 TABLET BY MOUTH EVERY EVENING.  30 tablet  3  . Norethindrone-Ethinyl Estradiol Biphasic (NECON 10/11, 28,) 0.5-35/1-35 MG-MCG tablet TAKE 1 TABLET BY MOUTH ONCE A DAY  28 tablet  10   No current facility-administered medications on file prior to visit.    No Known Allergies  Past Medical History  Diagnosis Date  . Acne   . Obesity     No past surgical history on file.  Family History  Problem Relation Age of Onset  . Cancer Mother 30    cervial and ovarian  . Diabetes Other   . Hyperlipidemia Other   . Hypertension Other     History   Social History  . Marital Status: Single    Spouse Name: N/A    Number of Children: N/A  . Years of Education: N/A   Occupational History  . Not on file.    Social History Main Topics  . Smoking status: Never Smoker   . Smokeless tobacco: Not on file  . Alcohol Use: No  . Drug Use: No  . Sexual Activity: Not on file   Other Topics Concern  . Not on file   Social History Narrative   Lives with mom and brother.  Parents are divorced but dad is still involved in her life.  He lives close by.  Never been sexually active.  Wants to be a Pharmacist, hospital and a Geophysicist/field seismologist.   The PMH, PSH, Social History, Family History, Medications, and allergies have been reviewed in [facility], and have been updated if relevant.   Review of Systems See HPI No fevers No CP Sometimes pain radiates to suprapubic area/groin    Objective:    LMP 09/10/2013   Physical Exam  Nursing note and vitals reviewed. Constitutional: She is oriented to person, place, and time. She appears well-developed and well-nourished. No distress.  Abdominal: Soft. Bowel sounds are normal. She exhibits no distension and no mass. There is no tenderness. There is no rebound and no guarding.  No CVA tenderness  Neurological: She is alert and oriented to person, place, and time. She has normal reflexes.  Skin: Skin is warm and dry.          Assessment &  Plan:   Flank pain - Plan: Urinalysis Dipstick  Back pain - Plan: Urinalysis Dipstick No Follow-up on file.

## 2013-10-22 NOTE — Progress Notes (Signed)
Pre visit review using our clinic review tool, if applicable. No additional management support is needed unless otherwise documented below in the visit note. 

## 2013-10-22 NOTE — Assessment & Plan Note (Signed)
She was unable to leave urine sample today. UA done at previous OV (5/7) showed large blood. Very consistent with kidney stone. Stat CT of abdomen and pelvis. Rx given for Norco. The patient indicates understanding of these issues and agrees with the plan.

## 2013-10-22 NOTE — Patient Instructions (Signed)
Good to see you. Please stop by to see Rosaria Ferries on your way out. I will call you with the results.  Take Norco for pain- this will make you sleepy.

## 2013-10-22 NOTE — Telephone Encounter (Addendum)
Pt left v/m; pt has already spoken with Dr Deborra Medina and pt has just started a new job and request a letter for work that pt will be out of work until pt has urology consult. Pt request cb.

## 2013-10-22 NOTE — Telephone Encounter (Signed)
Per Rosaria Ferries, she has spoken to the pts mother and explained that [facility] will be calling her with appt.

## 2013-10-22 NOTE — Telephone Encounter (Signed)
Olivia Mackie from [facility] called report for Ct abdomen and pelvis with impression moderate to severe left hydronephrosis secondary to a 40mm poxilmal left ureteral stone. Report already faxed to office and will give to Dr Deborra Medina.

## 2013-10-22 NOTE — Addendum Note (Signed)
Addended by: Lucille Passy on: 10/22/2013 01:21 PM   Modules accepted: Orders

## 2013-10-22 NOTE — Telephone Encounter (Signed)
Spoke to pt and informed her that she may obtain a work excuse from urologist as she will be seen. Pts mother verbally expressed understanding and agreed to obtain from them

## 2013-10-22 NOTE — Addendum Note (Signed)
Addended by: Lucille Passy on: 10/22/2013 01:23 PM   Modules accepted: Orders

## 2013-10-22 NOTE — Telephone Encounter (Signed)
Diane Moss spoke to pt who has not yet received a call from [facility] with appt time as of yet. Upon receipt, letter will be written for pt to pickup

## 2013-10-23 ENCOUNTER — Encounter (HOSPITAL_COMMUNITY): Payer: Self-pay | Admitting: *Deleted

## 2013-10-23 ENCOUNTER — Encounter (HOSPITAL_COMMUNITY): Payer: Self-pay | Admitting: Pharmacy Technician

## 2013-10-24 NOTE — H&P (Signed)
History of Present Illness Consult for left ureteral stone referred by Dr. Deborra Medina.     1-left ureteral stone-June 2015- She initially presented with some flank pain a few weeks ago which has occurred intermittently over the past few weeks. She was treated with nitrofurantoin for UTI. Pain continued and she underwent a pelvic ultrasound which was normal. This morning the pain was severe. She was sent for CT scan which revealed a 7 mm left proximal ureteral stone with proximal hydroureteronephrosis (SSD 13 cm, HU 700, visible on scout). I reviewed all the images. There were no other stones.     No prior stones.     Today, she has some nausea but is staying hydrated. She continues to have some intermittent left flank pain. She has not seen the stone pass.    UA today few bacteria, 7-10 red cells.   Past Medical History Problems  1. History of esophageal reflux (V12.79)  Surgical History Problems  1. History of No Surgical Problems  Current Meds 1. Allegra TABS;  Therapy: (Recorded:01Jun2015) to Recorded 2. Necon 1/35 (21) TABS;  Therapy: (Recorded:01Jun2015) to Recorded  Allergies Medication  1. No Known Drug Allergies  Family History Problems  1. Family history of Nephronophthisis : Grandmother 2. No pertinent family history : Mother  Social History Problems    Daily caffeine consumption, 4-5 servings a day   Never a smoker   No alcohol use   Single  Review of Systems Genitourinary, constitutional, skin, eye, otolaryngeal, hematologic/lymphatic, cardiovascular, pulmonary, endocrine, musculoskeletal, gastrointestinal, neurological and psychiatric system(s) were reviewed and pertinent findings if present are noted.    Vitals Vital Signs [Data Includes: Last 1 Day]  Recorded: [id number] 04:16PM  Height: 5 ft 6 in Weight: 195 lb  BMI Calculated: 31.47 BSA Calculated: 1.98 Blood Pressure: 114 / 76 Temperature: 98.5 F Heart Rate: 83  Physical  Exam Constitutional: Well nourished and well developed . No acute distress.  Pulmonary: No respiratory distress and normal respiratory rhythm and effort.  Cardiovascular: Heart rate and rhythm are normal . No peripheral edema.  Abdomen: No CVA tenderness.  Neuro/Psych:. Mood and affect are appropriate.    Results/Data Urine [Data Includes: Last 1 Day]   [id number]  COLOR YELLOW   APPEARANCE CLEAR   SPECIFIC GRAVITY 1.020   pH 5.5   GLUCOSE NEG mg/dL  BILIRUBIN NEG   KETONE NEG mg/dL  BLOOD LARGE   PROTEIN NEG mg/dL  UROBILINOGEN 0.2 mg/dL  NITRITE NEG   LEUKOCYTE ESTERASE NEG   SQUAMOUS EPITHELIAL/HPF MODERATE   WBC NONE SEEN WBC/hpf  RBC 7-10 RBC/hpf  BACTERIA FEW   CRYSTALS NONE SEEN   CASTS NONE SEEN    Assessment Assessed  1. Ureteral stone (592.1)  Plan  Health Maintenance  1. UA With REFLEX; [Do Not Release]; Status:Complete;   Done: [id number] 04:04PM Ureteral stone  2. Follow-up Schedule Surgery Office  Follow-up  Status: Complete  Done: [id number]  URINE CULTURE; Status:In Progress - Specimen/Data Collected;  Done: [id number] Perform:[facility]; LKT:[id number]; Marked Important; Last Updated DS:KAJGOT, Santiago Glad; 10/22/2013 4:54:06 PM;Ordered; Today;  LXB:WIOMBTDH stone; Ordered RC:BULAGTXM, Malana Eberwein;   Discussion/Summary I discussed the CT findings with the patient and her mom. I drew them a picture of the anatomy.  I discussed with the patient and her mom the nature risk and benefits of continued stone passage, left shockwave lithotripsy, left ureteroscopy. All questions answered. They elect to proceed with left shockwave.    Signatures Electronically signed by : Festus Aloe, M.D.; Oct 22 2013  5:34PM EST

## 2013-10-25 ENCOUNTER — Ambulatory Visit (HOSPITAL_COMMUNITY)
Admission: RE | Admit: 2013-10-25 | Discharge: 2013-10-25 | Disposition: A | Discharge status: Home / Self Care | Payer: BC Managed Care – PPO | Source: Ambulatory Visit | Attending: Urology | Admitting: Urology

## 2013-10-25 ENCOUNTER — Encounter (HOSPITAL_COMMUNITY)
Admission: RE | Disposition: A | Discharge status: Home / Self Care | Payer: Self-pay | Source: Ambulatory Visit | Attending: Urology

## 2013-10-25 ENCOUNTER — Ambulatory Visit (HOSPITAL_COMMUNITY): Payer: BC Managed Care – PPO

## 2013-10-25 DIAGNOSIS — Z79899 Other long term (current) drug therapy: Secondary | ICD-10-CM | POA: Insufficient documentation

## 2013-10-25 DIAGNOSIS — N201 Calculus of ureter: Secondary | ICD-10-CM | POA: Insufficient documentation

## 2013-10-25 HISTORY — DX: Anxiety disorder, unspecified: F41.9

## 2013-10-25 HISTORY — DX: Headache: R51

## 2013-10-25 LAB — HCG, SERUM, QUALITATIVE: Preg, Serum: NEGATIVE

## 2013-10-25 SURGERY — LITHOTRIPSY, ESWL
Anesthesia: LOCAL | Laterality: Left

## 2013-10-25 MED ORDER — DIPHENHYDRAMINE HCL 25 MG PO CAPS
25.0000 mg | ORAL_CAPSULE | ORAL | Status: AC
  Administered 2013-10-25: 25 mg via ORAL
  Filled 2013-10-25: qty 1

## 2013-10-25 MED ORDER — HYDROCODONE-ACETAMINOPHEN 5-325 MG PO TABS
1.0000 | ORAL_TABLET | Freq: Once | ORAL | Status: AC
  Administered 2013-10-25: 1 via ORAL
  Filled 2013-10-25: qty 1

## 2013-10-25 MED ORDER — SODIUM CHLORIDE 0.9 % IV SOLN
INTRAVENOUS | Status: DC
  Administered 2013-10-25: 10:00:00 via INTRAVENOUS

## 2013-10-25 MED ORDER — CIPROFLOXACIN HCL 500 MG PO TABS
500.0000 mg | ORAL_TABLET | ORAL | Status: AC
  Administered 2013-10-25: 500 mg via ORAL
  Filled 2013-10-25: qty 1

## 2013-10-25 MED ORDER — DIAZEPAM 5 MG PO TABS
10.0000 mg | ORAL_TABLET | ORAL | Status: AC
  Administered 2013-10-25: 10 mg via ORAL
  Filled 2013-10-25: qty 2

## 2013-10-25 NOTE — Progress Notes (Signed)
Pt states pain is 7/10 however she is very sleepy and calm.

## 2013-10-25 NOTE — Op Note (Signed)
See [facility] scanned Op Note -  Left ESWL for 7 mm left proximal ureteral stone

## 2013-10-25 NOTE — Progress Notes (Signed)
Left flank area slightly red, skin intact.

## 2013-10-25 NOTE — Interval H&P Note (Signed)
History and Physical Interval Note:  10/25/2013 12:00 PM  Diane Moss  has presented today for surgery, with the diagnosis of LEFT URETERAL STONE  The various methods of treatment have been discussed with the patient and family. After consideration of risks, benefits and other options for treatment, the patient has consented to  Procedure(s): LEFT EXTRACORPOREAL SHOCK WAVE LITHOTRIPSY (ESWL) (Left) as a surgical intervention .  The patient's history has been reviewed, patient examined, no change in status, stable for surgery.  I have reviewed the patient's chart and labs.  Questions were answered to the patient's satisfaction.  Waiting on preg test. It is negative.    Festus Aloe

## 2013-10-25 NOTE — Discharge Instructions (Signed)
Conscious Sedation, Adult, Care After Refer to this sheet in the next few weeks. These instructions provide you with information on caring for yourself after your procedure. Your health care provider may also give you more specific instructions. Your treatment has been planned according to current medical practices, but problems sometimes occur. Call your health care provider if you have any problems or questions after your procedure. WHAT TO EXPECT AFTER THE PROCEDURE  After your procedure:  You may feel sleepy, clumsy, and have poor balance for several hours.  Vomiting may occur if you eat too soon after the procedure. HOME CARE INSTRUCTIONS  Do not participate in any activities where you could become injured for at least 24 hours. Do not:  Drive.  Swim.  Ride a bicycle.  Operate heavy machinery.  Cook.  Use power tools.  Climb ladders.  Work from a high place.  Do not make important decisions or sign legal documents until you are improved.  If you vomit, drink water, juice, or soup when you can drink without vomiting. Make sure you have little or no nausea before eating solid foods.  Only take over-the-counter or prescription medicines for pain, discomfort, or fever as directed by your health care provider.  Make sure you and your family fully understand everything about the medicines given to you, including what side effects may occur.  You should not drink alcohol, take sleeping pills, or take medicines that cause drowsiness for at least 24 hours.  If you smoke, do not smoke without supervision.  If you are feeling better, you may resume normal activities 24 hours after you were sedated.  Keep all appointments with your health care provider. SEEK MEDICAL CARE IF:  Your skin is pale or bluish in color.  You continue to feel nauseous or vomit.  Your pain is getting worse and is not helped by medicine.  You have bleeding or swelling.  You are still sleepy or  feeling clumsy after 24 hours. SEEK IMMEDIATE MEDICAL CARE IF:  You develop a rash.  You have difficulty breathing.  You develop any type of allergic problem.  You have a fever. MAKE SURE YOU: Understand these instructions Lithotripsy, Care After Refer to this sheet in the next few weeks. These instructions provide you with information on caring for yourself after your procedure. Your health care provider may also give you more specific instructions. Your treatment has been planned according to current medical practices, but problems sometimes occur. Call your health care provider if you have any problems or questions after your procedure. WHAT TO EXPECT AFTER THE PROCEDURE   Your urine may have a red tinge for a few days after treatment. Blood loss is usually minimal.  You may have soreness in the back or flank area. This usually goes away after a few days. The procedure can cause blotches or bruises on the back where the pressure wave enters the skin. These marks usually cause only minimal discomfort and should disappear in a short time.  Stone fragments should begin to pass within 24 hours of treatment. However, a delayed passage is not unusual.  You may have pain, discomfort, and feel sick to your stomach (nauseated) when the crushed fragments of stone are passed down the tube from the kidney to the bladder. Stone fragments can pass soon after the procedure and may last for up to 4 8 weeks.  A small number of patients may have severe pain when stone fragments are not able to pass, which leads  to an obstruction.  If your stone is greater than 1 inch (2.5 cm) in diameter or if you have multiple stones that have a combined diameter greater than 1 inch (2.5 cm), you may require more than one treatment.  If you had a stent placed prior to your procedure, you may experience some discomfort, especially during urination. You may experience the pain or discomfort in your flank or back, or you  may experience a sharp pain or discomfort at the base of your penis or in your lower abdomen. The discomfort usually lasts only a few minutes after urinating. HOME CARE INSTRUCTIONS   Rest at home until you feel your energy improving.  Only take over-the-counter or prescription medicines for pain, discomfort, or fever as directed by your health care provider. Depending on the type of lithotripsy, you may need to take antibiotics and anti-inflammatory medicines for a few days.  Drink enough water and fluids to keep your urine clear or pale yellow. This helps "flush" your kidneys. It helps pass any remaining pieces of stone and prevents stones from coming back.  Most people can resume daily activities within 1 2 days after standard lithotripsy. It can take longer to recover from laser and percutaneous lithotripsy.  If the stones are in your urinary system, you may be asked to strain your urine at home to look for stones. Any stones that are found can be sent to a medical lab for examination.  Visit your health care provider for a follow-up appointment in a few weeks. Your doctor may remove your stent if you have one. Your health care provider will also check to see whether stone particles still remain. SEEK MEDICAL CARE IF:   Your pain is not relieved by medicine.  You have a lasting nauseous feeling.  You feel there is too much blood in the urine.  You develop persistent problems with frequent or painful urination that does not at least partially improve after 2 days following the procedure.  You have a congested cough.  You feel lightheaded.  You develop a rash or any other signs that might suggest an allergic problem.  You develop any reaction or side effects to your medicine(s). SEEK IMMEDIATE MEDICAL CARE IF:   You experience severe back or flank pain or both.  You see nothing but blood when you urinate.  You cannot pass any urine at all.  You have a fever or shaking  chills.  You develop shortness of breath, difficulty breathing, or chest pain.  You develop vomiting that will not stop after 6 8 hours.  You have a fainting episode. Document Released: 05/30/2007 Document Revised: 02/28/2013 Document Reviewed: 11/23/2012 [facility] Patient Information 2014 [place], [place].

## 2013-10-30 NOTE — Telephone Encounter (Signed)
Spoke to pts mother who indicates that we had previously spoken. Pt has since passed 5 kidney stones and is feeling "a little better."

## 2014-02-13 ENCOUNTER — Telehealth: Payer: Self-pay

## 2014-02-13 ENCOUNTER — Ambulatory Visit: Payer: BC Managed Care – PPO | Admitting: Family Medicine

## 2014-02-13 ENCOUNTER — Emergency Department (HOSPITAL_COMMUNITY): Payer: BC Managed Care – PPO

## 2014-02-13 ENCOUNTER — Encounter (HOSPITAL_COMMUNITY): Payer: Self-pay | Admitting: Emergency Medicine

## 2014-02-13 ENCOUNTER — Emergency Department (HOSPITAL_COMMUNITY)
Admission: EM | Admit: 2014-02-13 | Discharge: 2014-02-13 | Disposition: A | Discharge status: Home / Self Care | Payer: BC Managed Care – PPO | Attending: Emergency Medicine | Admitting: Emergency Medicine

## 2014-02-13 ENCOUNTER — Telehealth: Payer: Self-pay | Admitting: Family Medicine

## 2014-02-13 DIAGNOSIS — R112 Nausea with vomiting, unspecified: Secondary | ICD-10-CM | POA: Diagnosis not present

## 2014-02-13 DIAGNOSIS — Z8659 Personal history of other mental and behavioral disorders: Secondary | ICD-10-CM | POA: Insufficient documentation

## 2014-02-13 DIAGNOSIS — Z3202 Encounter for pregnancy test, result negative: Secondary | ICD-10-CM | POA: Diagnosis not present

## 2014-02-13 DIAGNOSIS — Z872 Personal history of diseases of the skin and subcutaneous tissue: Secondary | ICD-10-CM | POA: Diagnosis not present

## 2014-02-13 DIAGNOSIS — R079 Chest pain, unspecified: Secondary | ICD-10-CM | POA: Insufficient documentation

## 2014-02-13 DIAGNOSIS — Z79899 Other long term (current) drug therapy: Secondary | ICD-10-CM | POA: Insufficient documentation

## 2014-02-13 DIAGNOSIS — N390 Urinary tract infection, site not specified: Secondary | ICD-10-CM | POA: Diagnosis not present

## 2014-02-13 DIAGNOSIS — E669 Obesity, unspecified: Secondary | ICD-10-CM | POA: Insufficient documentation

## 2014-02-13 DIAGNOSIS — R1033 Periumbilical pain: Secondary | ICD-10-CM | POA: Insufficient documentation

## 2014-02-13 LAB — HEPATIC FUNCTION PANEL
ALBUMIN: 3.5 g/dL (ref 3.5–5.2)
ALK PHOS: 51 U/L (ref 39–117)
ALT: 14 U/L (ref 0–35)
AST: 18 U/L (ref 0–37)
Bilirubin, Direct: <0.2 mg/dL (ref 0.0–0.3)
Total Bilirubin: 0.3 mg/dL (ref 0.3–1.2)
Total Protein: 7.1 g/dL (ref 6.0–8.3)

## 2014-02-13 LAB — CBC
HEMATOCRIT: 37.8 % (ref 36.0–46.0)
HEMOGLOBIN: 12.7 g/dL (ref 12.0–15.0)
MCH: 28.2 pg (ref 26.0–34.0)
MCHC: 33.6 g/dL (ref 30.0–36.0)
MCV: 84 fL (ref 78.0–100.0)
Platelets: 340 10*3/uL (ref 150–400)
RBC: 4.5 MIL/uL (ref 3.87–5.11)
RDW: 12.6 % (ref 11.5–15.5)
WBC: 6.7 10*3/uL (ref 4.0–10.5)

## 2014-02-13 LAB — URINALYSIS, ROUTINE W REFLEX MICROSCOPIC
BILIRUBIN URINE: NEGATIVE
Glucose, UA: NEGATIVE mg/dL
HGB URINE DIPSTICK: NEGATIVE
Ketones, ur: 15 mg/dL — AB
NITRITE: NEGATIVE
PH: 7.5 (ref 5.0–8.0)
Protein, ur: 30 mg/dL — AB
Specific Gravity, Urine: 1.027 (ref 1.005–1.030)
UROBILINOGEN UA: 0.2 mg/dL (ref 0.0–1.0)

## 2014-02-13 LAB — I-STAT TROPONIN, ED: Troponin i, poc: 0 ng/mL (ref 0.00–0.08)

## 2014-02-13 LAB — BASIC METABOLIC PANEL
Anion gap: 13 (ref 5–15)
BUN: 8 mg/dL (ref 6–23)
CHLORIDE: 102 meq/L (ref 96–112)
CO2: 24 meq/L (ref 19–32)
CREATININE: 0.64 mg/dL (ref 0.50–1.10)
Calcium: 9.1 mg/dL (ref 8.4–10.5)
GFR calc Af Amer: >90 mL/min (ref >90)
GFR calc non Af Amer: >90 mL/min (ref >90)
GLUCOSE: 100 mg/dL — AB (ref 70–99)
POTASSIUM: 3.6 meq/L — AB (ref 3.7–5.3)
Sodium: 139 mEq/L (ref 137–147)

## 2014-02-13 LAB — URINE MICROSCOPIC-ADD ON

## 2014-02-13 LAB — LIPASE, BLOOD: Lipase: 23 U/L (ref 11–59)

## 2014-02-13 LAB — PREGNANCY, URINE: Preg Test, Ur: NEGATIVE

## 2014-02-13 MED ORDER — SODIUM CHLORIDE 0.9 % IV BOLUS (SEPSIS)
1000.0000 mL | Freq: Once | INTRAVENOUS | Status: AC
  Administered 2014-02-13: 1000 mL via INTRAVENOUS

## 2014-02-13 MED ORDER — IOHEXOL 300 MG/ML  SOLN
80.0000 mL | Freq: Once | INTRAMUSCULAR | Status: AC | PRN
  Administered 2014-02-13: 80 mL via INTRAVENOUS

## 2014-02-13 MED ORDER — MORPHINE SULFATE 4 MG/ML IJ SOLN
4.0000 mg | Freq: Once | INTRAMUSCULAR | Status: AC
  Administered 2014-02-13: 4 mg via INTRAVENOUS
  Filled 2014-02-13: qty 1

## 2014-02-13 MED ORDER — ONDANSETRON HCL 4 MG/2ML IJ SOLN
4.0000 mg | Freq: Once | INTRAMUSCULAR | Status: AC
  Administered 2014-02-13: 4 mg via INTRAVENOUS
  Filled 2014-02-13: qty 2

## 2014-02-13 MED ORDER — CEPHALEXIN 500 MG PO CAPS: 500.0000 mg | ORAL_CAPSULE | Freq: Two times a day (BID) | ORAL | Status: DC

## 2014-02-13 MED ORDER — ONDANSETRON 4 MG PO TBDP: 4.0000 mg | ORAL_TABLET | Freq: Three times a day (TID) | ORAL | Status: DC | PRN

## 2014-02-13 MED ORDER — PROMETHAZINE HCL 25 MG/ML IJ SOLN
25.0000 mg | Freq: Once | INTRAMUSCULAR | Status: AC
  Administered 2014-02-13: 25 mg via INTRAVENOUS
  Filled 2014-02-13: qty 1

## 2014-02-13 MED ORDER — IOHEXOL 300 MG/ML  SOLN
25.0000 mL | Freq: Once | INTRAMUSCULAR | Status: AC | PRN
  Administered 2014-02-13: 25 mL via ORAL

## 2014-02-13 MED ORDER — ONDANSETRON 4 MG PO TBDP
8.0000 mg | ORAL_TABLET | Freq: Once | ORAL | Status: AC
  Administered 2014-02-13: 8 mg via ORAL
  Filled 2014-02-13: qty 2

## 2014-02-13 MED ORDER — PROMETHAZINE HCL 25 MG RE SUPP: 25.0000 mg | Freq: Four times a day (QID) | RECTAL | Status: DC | PRN

## 2014-02-13 NOTE — ED Notes (Signed)
Patient transported to CT 

## 2014-02-13 NOTE — ED Notes (Signed)
Patient transported to X-ray 

## 2014-02-13 NOTE — Telephone Encounter (Signed)
Agree with adv to go to ER now -thanks

## 2014-02-13 NOTE — ED Provider Notes (Signed)
Medical screening examination/treatment/procedure(s) were performed by non-physician practitioner and as supervising physician I was immediately available for consultation/collaboration.   EKG Interpretation   Date/Time:  Wednesday February 13 2014 12:06:34 EDT Ventricular Rate:  86 PR Interval:  128 QRS Duration: 80 QT Interval:  368 QTC Calculation: 440 R Axis:   86 Text Interpretation:  Normal sinus rhythm with sinus arrhythmia Normal ECG  No prior Confirmed by Mingo Amber  MD, [place] ([id number]) on 02/13/2014 8:39:10 PM        Evelina Bucy, MD 02/13/14 [id number]

## 2014-02-13 NOTE — Discharge Instructions (Signed)
Please follow up with your primary care physician in 1-2 days. If you do not have one please call the [facility] number listed above. Please take your antibiotic until completion. Please take all medications as prescribed. Please read all discharge instructions and return precautions.   Nausea and Vomiting Nausea is a sick feeling that often comes before throwing up (vomiting). Vomiting is a reflex where stomach contents come out of your mouth. Vomiting can cause severe loss of body fluids (dehydration). Children and elderly adults can become dehydrated quickly, especially if they also have diarrhea. Nausea and vomiting are symptoms of a condition or disease. It is important to find the cause of your symptoms. CAUSES   Direct irritation of the stomach lining. This irritation can result from increased acid production (gastroesophageal reflux disease), infection, food poisoning, taking certain medicines (such as nonsteroidal anti-inflammatory drugs), alcohol use, or tobacco use.  Signals from the brain.These signals could be caused by a headache, heat exposure, an inner ear disturbance, increased pressure in the brain from injury, infection, a tumor, or a concussion, pain, emotional stimulus, or metabolic problems.  An obstruction in the gastrointestinal tract (bowel obstruction).  Illnesses such as diabetes, hepatitis, gallbladder problems, appendicitis, kidney problems, cancer, sepsis, atypical symptoms of a heart attack, or eating disorders.  Medical treatments such as chemotherapy and radiation.  Receiving medicine that makes you sleep (general anesthetic) during surgery. DIAGNOSIS Your caregiver may ask for tests to be done if the problems do not improve after a few days. Tests may also be done if symptoms are severe or if the reason for the nausea and vomiting is not clear. Tests may include:  Urine tests.  Blood tests.  Stool tests.  Cultures (to look for evidence  of infection).  X-rays or other imaging studies. Test results can help your caregiver make decisions about treatment or the need for additional tests. TREATMENT You need to stay well hydrated. Drink frequently but in small amounts.You may wish to drink water, sports drinks, clear broth, or eat frozen ice pops or gelatin dessert to help stay hydrated.When you eat, eating slowly may help prevent nausea.There are also some antinausea medicines that may help prevent nausea. HOME CARE INSTRUCTIONS   Take all medicine as directed by your caregiver.  If you do not have an appetite, do not force yourself to eat. However, you must continue to drink fluids.  If you have an appetite, eat a normal diet unless your caregiver tells you differently.  Eat a variety of complex carbohydrates (rice, wheat, potatoes, bread), lean meats, yogurt, fruits, and vegetables.  Avoid high-fat foods because they are more difficult to digest.  Drink enough water and fluids to keep your urine clear or pale yellow.  If you are dehydrated, ask your caregiver for specific rehydration instructions. Signs of dehydration may include:  Severe thirst.  Dry lips and mouth.  Dizziness.  Dark urine.  Decreasing urine frequency and amount.  Confusion.  Rapid breathing or pulse. SEEK IMMEDIATE MEDICAL CARE IF:   You have blood or brown flecks (like coffee grounds) in your vomit.  You have black or bloody stools.  You have a severe headache or stiff neck.  You are confused.  You have severe abdominal pain.  You have chest pain or trouble breathing.  You do not urinate at least once every 8 hours.  You develop cold or clammy skin.  You continue to vomit for longer than 24 to 48 hours.  You have  a fever. MAKE SURE YOU:   Understand these instructions.  Will watch your condition.  Will get help right away if you are not doing well or get worse. Document Released: 05/10/2005 Document Revised:  08/02/2011 Document Reviewed: 10/07/2010 [facility] Patient Information 2015 [place], [place]. This information is not intended to replace advice given to you by your health care provider. Make sure you discuss any questions you have with your health care provider.  Urinary Tract Infection Urinary tract infections (UTIs) can develop anywhere along your urinary tract. Your urinary tract is your body's drainage system for removing wastes and extra water. Your urinary tract includes two kidneys, two ureters, a bladder, and a urethra. Your kidneys are a pair of bean-shaped organs. Each kidney is about the size of your fist. They are located below your ribs, one on each side of your spine. CAUSES Infections are caused by microbes, which are microscopic organisms, including fungi, viruses, and bacteria. These organisms are so small that they can only be seen through a microscope. Bacteria are the microbes that most commonly cause UTIs. SYMPTOMS  Symptoms of UTIs may vary by age and gender of the patient and by the location of the infection. Symptoms in young women typically include a frequent and intense urge to urinate and a painful, burning feeling in the bladder or urethra during urination. Older women and men are more likely to be tired, shaky, and weak and have muscle aches and abdominal pain. A fever may mean the infection is in your kidneys. Other symptoms of a kidney infection include pain in your back or sides below the ribs, nausea, and vomiting. DIAGNOSIS To diagnose a UTI, your caregiver will ask you about your symptoms. Your caregiver also will ask to provide a urine sample. The urine sample will be tested for bacteria and white blood cells. White blood cells are made by your body to help fight infection. TREATMENT  Typically, UTIs can be treated with medication. Because most UTIs are caused by a bacterial infection, they usually can be treated with the use of antibiotics. The choice of antibiotic  and length of treatment depend on your symptoms and the type of bacteria causing your infection. HOME CARE INSTRUCTIONS  If you were prescribed antibiotics, take them exactly as your caregiver instructs you. Finish the medication even if you feel better after you have only taken some of the medication.  Drink enough water and fluids to keep your urine clear or pale yellow.  Avoid caffeine, tea, and carbonated beverages. They tend to irritate your bladder.  Empty your bladder often. Avoid holding urine for long periods of time.  Empty your bladder before and after sexual intercourse.  After a bowel movement, women should cleanse from front to back. Use each tissue only once. SEEK MEDICAL CARE IF:   You have back pain.  You develop a fever.  Your symptoms do not begin to resolve within 3 days. SEEK IMMEDIATE MEDICAL CARE IF:   You have severe back pain or lower abdominal pain.  You develop chills.  You have nausea or vomiting.  You have continued burning or discomfort with urination. MAKE SURE YOU:   Understand these instructions.  Will watch your condition.  Will get help right away if you are not doing well or get worse. Document Released: 02/17/2005 Document Revised: 11/09/2011 Document Reviewed: 06/18/2011 [facility] Patient Information 2015 [place], [place]. This information is not intended to replace advice given to you by your health care provider. Make sure you discuss  any questions you have with your health care provider.

## 2014-02-13 NOTE — ED Notes (Signed)
Pt arrives POV from home with large emesis basin full of bile appearance. States was eating last night at [employer] last night. Thought she ate the bread too fast. Began having stabbing chest pain on anterior chest and stabbing in back. Began vomiting. Pain subsided before going to bed last night. Then again this [place] patient was sitting in class. Again stabbing chest pains and vomiting. Pt awake, alert, oriented, VSS.

## 2014-02-13 NOTE — Telephone Encounter (Signed)
Patient Information:  Caller Name: Lelon Frohlich  Phone: [phone]  Patient: Aradia, Estey  Gender: Female  DOB: 1996/02/23  Age: 18 Years  PCP: Arnette Norris [facility])  Pregnant: No  Office Follow Up:  Does the office need to follow up with this patient?: No  Instructions For The Office: N/A  RN Note:  Spoke with Rina in office - sending to [facility] and cancelling OV at 4:45 pm.  Per mother's request [facility] notified of pending arrival of patient and symptoms.  Symptoms  Reason For Call & Symptoms: Martin Majestic out to supper with family yesterday 9/22, ate a few bites and started nausea and stomachache right away.  Since then episodes of stomachache then sharp pain to chest and when pain present has difficulty breathing.  Then vomits.  Pain in chest present less than 5 minutes each time but episodes the same.  Afebrile.  When vomiting can hear a butp also.  Not keeping anything down at all.  Reviewed Health History In EMR: Yes  Reviewed Medications In EMR: Yes  Reviewed Allergies In EMR: Yes  Reviewed Surgeries / Procedures: Yes  Date of Onset of Symptoms: 02/12/2014  Treatments Tried: sips flat gingerale  Treatments Tried Worked: No OB / GYN:  LMP: 01/30/2014  Guideline(s) Used:  Vomiting  Chest Pain  Disposition Per Guideline:   Go to ED Now  Reason For Disposition Reached:   Severe vomiting (e.g., 6 or more times/day)  Advice Given:  N/A  Patient Will Follow Care Advice:  YES

## 2014-02-13 NOTE — ED Notes (Signed)
Patient given ginger ale to assist with needed urine sample

## 2014-02-13 NOTE — ED Notes (Signed)
Fluid challenge started we'll continue to monitor.

## 2014-02-13 NOTE — Telephone Encounter (Signed)
Diane Moss with [facility] said since last night pt has vomited x 20 with upper stomach cramping that goes up into chest;CP does not last more than 5 mins but pt has SOB with CP.No radiation of pain from chest. Pt already has appt with Dr Glori Bickers this afternoon at 4:45 pm but [facility] has ED disposition. Spoke with RN team lead and advised pt to go to ED now and cancel Dr Glori Bickers appt. Diane Moss voiced understanding and notified pts mother who was in agreement.

## 2014-02-13 NOTE — ED Provider Notes (Signed)
CSN: [id number]     Arrival date & time 02/13/14  1158 History   First MD Initiated Contact with Patient 02/13/14 1654     Chief Complaint  Patient presents with  . Chest Pain  . Emesis     (Consider location/radiation/quality/duration/timing/severity/associated sxs/prior Treatment) HPI Comments: Patient is a 18 yo F PMHx significant for anxiety, headaches presenting to the emergency department for acute onset, multiple episodes of bilious emesis, umbilical abdominal pain, stabbing chest pain that began last evening at dinner. She is concerned that she ate the bread to quickly causing her symptoms. Patient states Thursday night she did not feel well and had a fever (TMAX 102F). Patient states she has continued to have subjective fevers and chills through today. No alleviating factors. Patient has been unable to tolerate any by mouth intake. Denies any diarrhea, urinary or vaginal symptoms. No abdominal surgical history. Last menstrual period was September 9.  Patient is a 18 y.o. female presenting with chest pain and vomiting.  Chest Pain Associated symptoms: abdominal pain, nausea and vomiting   Emesis Associated symptoms: abdominal pain and chills   Associated symptoms: no diarrhea     Past Medical History  Diagnosis Date  . Acne   . Obesity   . Anxiety   . Headache(784.0)    History reviewed. No pertinent past surgical history. Family History  Problem Relation Age of Onset  . Cancer Mother 30    cervial and ovarian  . Diabetes Other   . Hyperlipidemia Other   . Hypertension Other    History  Substance Use Topics  . Smoking status: Never Smoker   . Smokeless tobacco: Never Used  . Alcohol Use: No   OB History   Grav Para Term Preterm Abortions TAB SAB Ect Mult Living                 Review of Systems  Constitutional: Positive for chills.  Cardiovascular: Positive for chest pain.  Gastrointestinal: Positive for nausea, vomiting and abdominal pain. Negative for  diarrhea and constipation.  All other systems reviewed and are negative.     Allergies  Peach flavor  Home Medications   Prior to Admission medications   Medication Sig Start Date End Date Taking? Authorizing Provider  acetaminophen (TYLENOL) 500 MG tablet Take 250-500 mg by mouth every 6 (six) hours as needed for moderate pain.   Yes Historical Provider, MD  fexofenadine-pseudoephedrine (ALLEGRA-D) 60-120 MG per tablet Take 1 tablet by mouth 2 (two) times daily as needed (for allergies).    Yes Historical Provider, MD  cephALEXin (KEFLEX) 500 MG capsule Take 1 capsule (500 mg total) by mouth 2 (two) times daily. 02/13/14   Kamarion Zagami L Brysten Reister, PA-C  ondansetron (ZOFRAN ODT) 4 MG disintegrating tablet Take 1 tablet (4 mg total) by mouth every 8 (eight) hours as needed for nausea or vomiting. 02/13/14   Stephani Police Wiliam Cauthorn, PA-C  promethazine (PHENERGAN) 25 MG suppository Place 1 suppository (25 mg total) rectally every 6 (six) hours as needed for nausea or vomiting. 02/13/14   Anderson Malta L Dorcas Melito, PA-C   BP 114/53  Pulse 59  Temp(Src) 98.3 F (36.8 C) (Oral)  Resp 18  SpO2 100%  LMP 01/30/2014 Physical Exam  Nursing note and vitals reviewed. Constitutional: She is oriented to person, place, and time. She appears well-developed and well-nourished.  HENT:  Head: Normocephalic and atraumatic.  Right Ear: External ear normal.  Left Ear: External ear normal.  Nose: Nose normal.  Mouth/Throat: Uvula is  midline and oropharynx is clear and moist. Mucous membranes are dry.  Eyes: Conjunctivae are normal.  Neck: Neck supple.  Cardiovascular: Normal rate, regular rhythm and normal heart sounds.   Pulmonary/Chest: Effort normal and breath sounds normal. She exhibits no tenderness.  Abdominal: Soft. Normal appearance and bowel sounds are normal. She exhibits no distension. There is tenderness (mild) in the periumbilical area. There is no rigidity, no rebound, no guarding and no CVA  tenderness.  Patient with bilious emesis in basin.   Neurological: She is alert and oriented to person, place, and time.  Skin: Skin is warm and dry. She is not diaphoretic.    ED Course  Procedures (including critical care time) Medications  ondansetron (ZOFRAN-ODT) disintegrating tablet 8 mg (8 mg Oral Given 02/13/14 1411)  sodium chloride 0.9 % bolus 1,000 mL (0 mLs Intravenous Stopped 02/13/14 1850)  ondansetron (ZOFRAN) injection 4 mg (4 mg Intravenous Given 02/13/14 1737)  morphine 4 MG/ML injection 4 mg (4 mg Intravenous Given 02/13/14 1738)  iohexol (OMNIPAQUE) 300 MG/ML solution 25 mL (25 mLs Oral Contrast Given 02/13/14 1855)  iohexol (OMNIPAQUE) 300 MG/ML solution 80 mL (80 mLs Intravenous Contrast Given 02/13/14 1952)  morphine 4 MG/ML injection 4 mg (4 mg Intravenous Given 02/13/14 2041)  ondansetron (ZOFRAN) injection 4 mg (4 mg Intravenous Given 02/13/14 2041)  sodium chloride 0.9 % bolus 1,000 mL (0 mLs Intravenous Stopped 02/13/14 2208)  promethazine (PHENERGAN) injection 25 mg (25 mg Intravenous Given 02/13/14 2118)    Labs Review Labs Reviewed  BASIC METABOLIC PANEL - Abnormal; Notable for the following:    Potassium 3.6 (*)    Glucose, Bld 100 (*)    All other components within normal limits  URINALYSIS, ROUTINE W REFLEX MICROSCOPIC - Abnormal; Notable for the following:    Color, Urine AMBER (*)    APPearance CLOUDY (*)    Ketones, ur 15 (*)    Protein, ur 30 (*)    Leukocytes, UA MODERATE (*)    All other components within normal limits  URINE MICROSCOPIC-ADD ON - Abnormal; Notable for the following:    Squamous Epithelial / LPF MANY (*)    Bacteria, UA MANY (*)    All other components within normal limits  CBC  HEPATIC FUNCTION PANEL  PREGNANCY, URINE  LIPASE, BLOOD  I-STAT TROPOININ, ED    Imaging Review Dg Chest 2 View  02/13/2014   CLINICAL DATA:  Chest pain.  EXAM: CHEST  2 VIEW  COMPARISON:  None.  FINDINGS: The heart size and mediastinal contours are  within normal limits. Both lungs are clear. The visualized skeletal structures are unremarkable.  IMPRESSION: Normal chest x-ray.   Electronically Signed   By: Kalman Jewels M.D.   On: 02/13/2014 18:44   Ct Abdomen Pelvis W Contrast  02/13/2014   CLINICAL DATA:  18 year old female with nausea and chest/epigastric pain  EXAM: CT ABDOMEN AND PELVIS WITH CONTRAST  TECHNIQUE: Multidetector CT imaging of the abdomen and pelvis was performed using the standard protocol following bolus administration of intravenous contrast.  CONTRAST:  82mL OMNIPAQUE IOHEXOL 300 MG/ML  SOLN  COMPARISON:  Abdominal plain film 11/13/2013, 10/25/2013  FINDINGS: Lower chest:  Unremarkable appearance of the soft tissues.  Heart size within normal limits.  No pericardial fluid/thickening.  No lower mediastinal adenopathy.  No confluent airspace disease, visualized pneumothorax, or pleural effusion.  Abdomen/pelvis:  Unremarkable appearance of the liver and spleen. Unremarkable appearance of bilateral adrenal glands.  No pericholecystic or peripancreatic fluid or inflammatory  changes.  Differential attenuation of the fluid within the gallbladder lumen, which may represent a biliary sludge.  No intrahepatic or extrahepatic biliary ductal dilatation.  Unremarkable appearance the bilateral kidneys without nephrolithiasis or hydronephrosis. No ureteral stones identified.  No free intraperitoneal air.  No significant free fluid.  No abnormal distension of small bowel or colon. No transition point. Enteric contrast present within small bowel. Normal appendix.  Unremarkable appearance of uterus and adnexa.  Unremarkable appearance of the vasculature with no significant atherosclerotic disease.  No displaced fracture.  IMPRESSION: No acute CT findings to account for the patient's complaints of abdominal pain and nausea.  Signed,  Dulcy Fanny. Earleen Newport, DO  [facility]  [facility]   Electronically Signed    By: Corrie Mckusick O.D.   On: 02/13/2014 20:41     EKG Interpretation   Date/Time:  Wednesday February 13 2014 12:06:34 EDT Ventricular Rate:  86 PR Interval:  128 QRS Duration: 80 QT Interval:  368 QTC Calculation: 440 R Axis:   86 Text Interpretation:  Normal sinus rhythm with sinus arrhythmia Normal ECG  No prior Confirmed by Mingo Amber  MD, [place] ([id number]) on 02/13/2014 8:39:10 PM      MDM   Final diagnoses:  Nausea and vomiting in adult patient  UTI (lower urinary tract infection)    Filed Vitals:   02/13/14 2255  BP:   Pulse:   Temp: 98.3 F (36.8 C)  Resp:     Afebrile, NAD, non-toxic appearing, AAOx4.  I have reviewed nursing notes, vital signs, and all appropriate lab and imaging results for this patient.  Patient is nontoxic, nonseptic appearing, in no apparent distress.  Patient's pain and other symptoms adequately managed in emergency department.  Fluid bolus given.  Labs, imaging and vitals reviewed.  Patient does not meet the SIRS or Sepsis criteria.  On repeat exam patient does not have a surgical abdomen and there are no peritoneal signs.  No indication of appendicitis, bowel obstruction, bowel perforation, cholecystitis, diverticulitis, PID or ectopic pregnancy. Patient able to tolerate by mouth intake without difficulty prior to discharge. Patient discharged home with symptomatic treatment and given strict instructions for follow-up with their primary care physician.  I have also discussed reasons to return immediately to the ER.  Patient expresses understanding and agrees with plan. Patient stable at time of discharge. Discussed patient case with Dr. Mingo Amber who agrees with plan.      Harlow Mares, PA-C 02/13/14 2347

## 2014-03-01 ENCOUNTER — Other Ambulatory Visit: Payer: Self-pay | Admitting: *Deleted

## 2014-03-01 MED ORDER — NORETHIN-ETH ESTRAD BIPHASIC 0.5-35/1-35 MG-MCG PO TABS: ORAL_TABLET | ORAL | Status: DC

## 2014-03-01 NOTE — Telephone Encounter (Signed)
Pt requesting medication refill of Necon. LM on pts vm; will need CPE for refills

## 2014-04-24 ENCOUNTER — Encounter: Payer: Self-pay | Admitting: Family Medicine

## 2014-04-24 ENCOUNTER — Ambulatory Visit (INDEPENDENT_AMBULATORY_CARE_PROVIDER_SITE_OTHER): Payer: BC Managed Care – PPO | Admitting: Family Medicine

## 2014-04-24 VITALS — BP 112/78 | HR 60 | Temp 98.2°F | Wt 201.5 lb

## 2014-04-24 DIAGNOSIS — D352 Benign neoplasm of pituitary gland: Secondary | ICD-10-CM

## 2014-04-24 DIAGNOSIS — N920 Excessive and frequent menstruation with regular cycle: Secondary | ICD-10-CM | POA: Insufficient documentation

## 2014-04-24 DIAGNOSIS — N921 Excessive and frequent menstruation with irregular cycle: Secondary | ICD-10-CM

## 2014-04-24 MED ORDER — DROSPIRENONE-ETHINYL ESTRADIOL 3-0.02 MG PO TABS: 1.0000 | ORAL_TABLET | Freq: Every day | ORAL | Status: DC

## 2014-04-24 NOTE — Assessment & Plan Note (Signed)
New- >25 minutes spent in face to face time with patient, >50% spent in counselling or coordination of care Discussed tx options.  She is already taking higher estrogen OCP preparation.  I suggested we either change OCP classes, like try Yaz or try another alternative like nuvaring or depo. She wanted to try yaz. eRx sent.

## 2014-04-24 NOTE — Assessment & Plan Note (Signed)
Overdue for repeat MRI and now with changing menstrual cycle. Will order repeat MRI. The patient indicates understanding of these issues and agrees with the plan.

## 2014-04-24 NOTE — Progress Notes (Signed)
Subjective:   Patient ID: Diane Moss, female    DOB: [birth year], 18 y.o.   MRN: [id number]  Diane Moss is a pleasant 18 y.o. year old female who presents to clinic today with Menstrual Problem  on 04/24/2014  HPI: 18 yo pleasant female here for irregular periods.  Has been taking current OCP, Necon, since 2012. Spotting and irregular cycles have been progressing for past several months.  H/o pituitary adenoma- was followed by neurology.  Overdue for repeat MRI.  Denies any vomiting or visual changes.  Does occasionally have headaches.  Current Outpatient Prescriptions on File Prior to Visit  Medication Sig Dispense Refill  . acetaminophen (TYLENOL) 500 MG tablet Take 250-500 mg by mouth every 6 (six) hours as needed for moderate pain.    . cephALEXin (KEFLEX) 500 MG capsule Take 1 capsule (500 mg total) by mouth 2 (two) times daily. (Patient not taking: Reported on 04/24/2014) 14 capsule 0  . fexofenadine-pseudoephedrine (ALLEGRA-D) 60-120 MG per tablet Take 1 tablet by mouth 2 (two) times daily as needed (for allergies).     . ondansetron (ZOFRAN ODT) 4 MG disintegrating tablet Take 1 tablet (4 mg total) by mouth every 8 (eight) hours as needed for nausea or vomiting. (Patient not taking: Reported on 04/24/2014) 20 tablet 0  . promethazine (PHENERGAN) 25 MG suppository Place 1 suppository (25 mg total) rectally every 6 (six) hours as needed for nausea or vomiting. (Patient not taking: Reported on 04/24/2014) 10 each 0   No current facility-administered medications on file prior to visit.    Allergies  Allergen Reactions  . Peach Flavor Swelling and Rash    Past Medical History  Diagnosis Date  . Acne   . Obesity   . Anxiety   . Headache(784.0)     No past surgical history on file.  Family History  Problem Relation Age of Onset  . Cancer Mother 30    cervial and ovarian  . Diabetes Other   . Hyperlipidemia Other   . Hypertension Other     History   Social  History  . Marital Status: Single    Spouse Name: N/A    Number of Children: N/A  . Years of Education: N/A   Occupational History  . Not on file.   Social History Main Topics  . Smoking status: Never Smoker   . Smokeless tobacco: Never Used  . Alcohol Use: No  . Drug Use: No  . Sexual Activity: Not on file   Other Topics Concern  . Not on file   Social History Narrative   Lives with mom and brother.  Parents are divorced but dad is still involved in her life.  He lives close by.  Never been sexually active.  Wants to be a Pharmacist, hospital and a Geophysicist/field seismologist.   The PMH, PSH, Social History, Family History, Medications, and allergies have been reviewed in [facility], and have been updated if relevant.   Review of Systems  Constitutional: Negative for chills, diaphoresis, activity change and appetite change.  Eyes: Negative for photophobia and visual disturbance.  Gastrointestinal: Negative for nausea and vomiting.  Genitourinary: Positive for menstrual problem. Negative for urgency, decreased urine volume, vaginal discharge, vaginal pain and pelvic pain.  Skin: Negative.   Hematological: Negative.   Psychiatric/Behavioral: Negative.   All other systems reviewed and are negative.      Objective:    BP 112/78 mmHg  Pulse 60  Temp(Src) 98.2 F (36.8 C) (Oral)  Wt 201  lb 8 oz (91.4 kg)  LMP 04/05/2014   Physical Exam  Constitutional: She is oriented to person, place, and time. She appears well-developed and well-nourished. No distress.  HENT:  Head: Normocephalic.  Cardiovascular: Normal rate.   Pulmonary/Chest: Effort normal.  Neurological: She is alert and oriented to person, place, and time.  Skin: Skin is warm and dry.  Psychiatric: She has a normal mood and affect. Her behavior is normal. Judgment and thought content normal.  Nursing note and vitals reviewed.         Assessment & Plan:   Menorrhagia with irregular cycle  Pituitary microadenoma - Plan: MR Brain W Wo  Contrast No Follow-up on file.

## 2014-04-24 NOTE — Patient Instructions (Signed)
Great to see you. Happy Holidays.  We are starting Yaz.  We will call you with your MRI appointment.

## 2014-04-24 NOTE — Progress Notes (Signed)
Pre visit review using our clinic review tool, if applicable. No additional management support is needed unless otherwise documented below in the visit note. 

## 2014-05-09 ENCOUNTER — Encounter: Payer: Self-pay | Admitting: Family Medicine

## 2014-05-09 ENCOUNTER — Ambulatory Visit: Payer: Self-pay | Admitting: Family Medicine

## 2014-05-10 ENCOUNTER — Telehealth: Payer: Self-pay | Admitting: Family Medicine

## 2014-05-10 NOTE — Telephone Encounter (Signed)
[name] (pt mom) called wanting someone to call her about Diane Moss she had done yesterday.  She stated that someone talked to [name] has lots of questions that she didn't ask

## 2014-05-14 NOTE — Telephone Encounter (Signed)
Spoke with pt's mom- I did advise that she see Dr. Gabriel Carina again as my result note states.  She will call Dr. Gabriel Carina to make an appt.  Please make sure that we did fax the results to Dr. Gabriel Carina with [facility].

## 2014-10-10 ENCOUNTER — Ambulatory Visit: Payer: BC Managed Care – PPO | Admitting: Family Medicine

## 2014-10-10 ENCOUNTER — Ambulatory Visit (INDEPENDENT_AMBULATORY_CARE_PROVIDER_SITE_OTHER): Payer: BC Managed Care – PPO | Admitting: Family Medicine

## 2014-10-10 ENCOUNTER — Encounter: Payer: Self-pay | Admitting: Family Medicine

## 2014-10-10 VITALS — BP 123/86 | HR 85 | Temp 97.9°F | Ht 66.0 in | Wt 218.2 lb

## 2014-10-10 DIAGNOSIS — R350 Frequency of micturition: Secondary | ICD-10-CM

## 2014-10-10 DIAGNOSIS — M549 Dorsalgia, unspecified: Secondary | ICD-10-CM | POA: Diagnosis not present

## 2014-10-10 DIAGNOSIS — Z87442 Personal history of urinary calculi: Secondary | ICD-10-CM | POA: Insufficient documentation

## 2014-10-10 LAB — POCT URINALYSIS DIPSTICK
BILIRUBIN UA: NEGATIVE
Blood, UA: NEGATIVE
GLUCOSE UA: NEGATIVE
Ketones, UA: NEGATIVE
Leukocytes, UA: NEGATIVE
NITRITE UA: NEGATIVE
Protein, UA: NEGATIVE
Spec Grav, UA: >=1.03
Urobilinogen, UA: 0.2
pH, UA: 6

## 2014-10-10 NOTE — Progress Notes (Signed)
Pre visit review using our clinic review tool, if applicable. No additional management support is needed unless otherwise documented below in the visit note. 

## 2014-10-10 NOTE — Progress Notes (Signed)
   Subjective:    Patient ID: Diane Moss, female    DOB: Aug 18, 1995, 19 y.o.   MRN: [id number]  Urinary Frequency  This is a new problem. The current episode started 1 to 4 weeks ago (started  mid back pain). The problem has been gradually worsening. Quality: no pain. There has been no fever. She is not sexually active. There is no history of pyelonephritis. Associated symptoms include flank pain and frequency. Pertinent negatives include no chills, hematuria, hesitancy, nausea, sweats, urgency or vomiting. She has tried increased fluids for the symptoms. The treatment provided no relief. Her past medical history is significant for kidney stones. There is no history of catheterization, recurrent UTIs, a single kidney, urinary stasis or a urological procedure.  Last kidney stone in last year, lithotripsy.  Back Pain The current episode started 1 to 4 weeks ago. The problem has been waxing and waning since onset. The pain is present in the thoracic spine (left mid back). The quality of the pain is described as aching. The pain does not radiate. The pain is mild.     Review of Systems  Constitutional: Negative for chills.  Gastrointestinal: Negative for nausea and vomiting.  Genitourinary: Positive for frequency and flank pain. Negative for hesitancy, urgency and hematuria.  Musculoskeletal: Positive for back pain.       Objective:   Physical Exam  Constitutional: Vital signs are normal. She appears well-developed and well-nourished. She is cooperative.  Non-toxic appearance. She does not appear ill. No distress.  HENT:  Head: Normocephalic.  Right Ear: Hearing, tympanic membrane, external ear and ear canal normal. Tympanic membrane is not erythematous, not retracted and not bulging.  Left Ear: Hearing, tympanic membrane, external ear and ear canal normal. Tympanic membrane is not erythematous, not retracted and not bulging.  Nose: No mucosal edema or rhinorrhea. Right sinus exhibits no  maxillary sinus tenderness and no frontal sinus tenderness. Left sinus exhibits no maxillary sinus tenderness and no frontal sinus tenderness.  Mouth/Throat: Uvula is midline, oropharynx is clear and moist and mucous membranes are normal.  Eyes: Conjunctivae, EOM and lids are normal. Pupils are equal, round, and reactive to light. Lids are everted and swept, no foreign bodies found.  Neck: Trachea normal and normal range of motion. Neck supple. Carotid bruit is not present. No thyroid mass and no thyromegaly present.  Cardiovascular: Normal rate, regular rhythm, S1 normal, S2 normal, normal heart sounds, intact distal pulses and normal pulses.  Exam reveals no gallop and no friction rub.   No murmur heard. Pulmonary/Chest: Effort normal and breath sounds normal. No tachypnea. No respiratory distress. She has no decreased breath sounds. She has no wheezes. She has no rhonchi. She has no rales.  Abdominal: Soft. Normal appearance and bowel sounds are normal. There is no tenderness. There is no CVA tenderness.  Musculoskeletal:       Thoracic back: Normal. She exhibits normal range of motion, no tenderness, no bony tenderness and no swelling.       Lumbar back: Normal.  Neurological: She is alert. She has normal strength. No sensory deficit.  Skin: Skin is warm, dry and intact. No rash noted.  Psychiatric: Her speech is normal and behavior is normal. Judgment and thought content normal. Her mood appears not anxious. Cognition and memory are normal. She does not exhibit a depressed mood.          Assessment & Plan:

## 2014-10-10 NOTE — Patient Instructions (Signed)
No clear sign of infeciton or stone.  Most likely muscle strain. Push water. Can use ibuprofen for pain. Start heat andf gentle low back stretching.

## 2014-10-10 NOTE — Assessment & Plan Note (Signed)
Urine clear, no sign of infection or stone.  Symptoms mild.  Most likely MSK strain.. Treat with heat, NSAIDs and home stretching. If not improving consider KUB to eval further for stone as they we CA based in past.

## 2014-10-23 ENCOUNTER — Other Ambulatory Visit: Payer: Self-pay

## 2014-10-23 MED ORDER — FLUOXETINE HCL 20 MG PO TABS: ORAL_TABLET | ORAL | Status: DC

## 2014-10-23 NOTE — Telephone Encounter (Signed)
Pt left v/m requesting refill on fluoxetine 20 mg; was on hx med list due to note pt had not been taking med.Please advise.

## 2014-11-19 ENCOUNTER — Other Ambulatory Visit: Payer: Self-pay | Admitting: Family Medicine

## 2014-11-19 NOTE — Telephone Encounter (Signed)
Pt recently restarted requested medication. Ok to fill?

## 2015-03-14 ENCOUNTER — Telehealth: Payer: Self-pay | Admitting: Family Medicine

## 2015-03-14 NOTE — Telephone Encounter (Signed)
Patient Name: Diane Moss  DOB: 07-Feb-1996    Initial Comment Caller states, she is not supposed to start her period for 2 weeks, she is on [facility], she started her period just now.    Nurse Assessment  Nurse: Raphael Gibney, RN, Vanita Ingles Date/Time Eilene Ghazi Time): 03/14/2015 11:36:00 AM  Confirm and document reason for call. If symptomatic, describe symptoms. ---Caller states she is on birth control pills. Period is not due for 2 weeks. She is having a light period. No pain. Has happened before.  Has the patient traveled out of the country within the last 30 days? ---Not Applicable  Does the patient have any new or worsening symptoms? ---Yes  Will a triage be completed? ---Yes  Related visit to physician within the last 2 weeks? ---No  Does the PT have any chronic conditions? (i.e. diabetes, asthma, etc.) ---Yes  List chronic conditions. ---history of kidney stones  Did the patient indicate they were pregnant? ---No     Guidelines    Guideline Title Affirmed Question Affirmed Notes  Vaginal Bleeding - Abnormal [1] Menstrual cycle < 21 days OR > 35 days AND [2] occurs more than two cycles (2 months) this past year    Final Disposition User   See PCP within 2 Maudry Diego, RN, Vanita Ingles    Comments  Pt is at college 2.5 hrs away and states she will call the student clinic and make appt.   Disagree/Comply: Comply

## 2015-04-16 ENCOUNTER — Ambulatory Visit (INDEPENDENT_AMBULATORY_CARE_PROVIDER_SITE_OTHER): Payer: BC Managed Care – PPO | Admitting: Family Medicine

## 2015-04-16 ENCOUNTER — Encounter: Payer: Self-pay | Admitting: Family Medicine

## 2015-04-16 VITALS — BP 132/88 | HR 90 | Temp 97.9°F | Wt 218.8 lb

## 2015-04-16 DIAGNOSIS — N921 Excessive and frequent menstruation with irregular cycle: Secondary | ICD-10-CM | POA: Diagnosis not present

## 2015-04-16 DIAGNOSIS — Z30011 Encounter for initial prescription of contraceptive pills: Secondary | ICD-10-CM

## 2015-04-16 DIAGNOSIS — Z309 Encounter for contraceptive management, unspecified: Secondary | ICD-10-CM | POA: Insufficient documentation

## 2015-04-16 MED ORDER — NORETHIN-ETH ESTRAD TRIPHASIC 0.5/0.75/1-35 MG-MCG PO TABS: 1.0000 | ORAL_TABLET | Freq: Every day | ORAL | Status: DC

## 2015-04-16 NOTE — Progress Notes (Signed)
   Subjective:   Patient ID: Diane Moss, female    DOB: Jan 11, 1996, 19 y.o.   MRN: [id number]  Eddie Zec is a pleasant 19 y.o. year old female who presents to clinic today with Vaginal Bleeding  on 04/16/2015  HPI: Menorrhagia-  Has noticed for months, has break through bleeding every month with Yaz. Cramping is still heavy during her period.  Current Outpatient Prescriptions on File Prior to Visit  Medication Sig Dispense Refill  . FLUoxetine (PROZAC) 20 MG tablet TAKE 1/2 TAB BY MOUTH EVERY EVENING X1 WEEK, THEN INCREASE TO 1 TAB EVERY EVENING 30 tablet 0   No current facility-administered medications on file prior to visit.    Allergies  Allergen Reactions  . Peach Flavor Swelling and Rash    Past Medical History  Diagnosis Date  . Acne   . Obesity   . Anxiety   . Headache(784.0)     No past surgical history on file.  Family History  Problem Relation Age of Onset  . Cancer Mother 30    cervial and ovarian  . Diabetes Other   . Hyperlipidemia Other   . Hypertension Other     Social History   Social History  . Marital Status: Single    Spouse Name: N/A  . Number of Children: N/A  . Years of Education: N/A   Occupational History  . Not on file.   Social History Main Topics  . Smoking status: Never Smoker   . Smokeless tobacco: Never Used  . Alcohol Use: No  . Drug Use: No  . Sexual Activity: Not on file   Other Topics Concern  . Not on file   Social History Narrative   Lives with mom and brother.  Parents are divorced but dad is still involved in her life.  He lives close by.  Never been sexually active.  Wants to be a Pharmacist, hospital and a Geophysicist/field seismologist.   The PMH, PSH, Social History, Family History, Medications, and allergies have been reviewed in [facility], and have been updated if relevant.     Review of Systems  Constitutional: Negative.   Respiratory: Negative.   Cardiovascular: Negative.   Genitourinary: Positive for vaginal  bleeding. Negative for urgency, vaginal pain and pelvic pain.  All other systems reviewed and are negative.      Objective:    BP 132/88 mmHg  Pulse 90  Temp(Src) 97.9 F (36.6 C) (Oral)  Wt 218 lb 12 oz (99.224 kg)  SpO2 98%  LMP 03/28/2015   Physical Exam  Constitutional: She is oriented to person, place, and time. She appears well-developed and well-nourished. No distress.  HENT:  Head: Normocephalic.  Eyes: Conjunctivae are normal.  Cardiovascular: Normal rate.   Pulmonary/Chest: Effort normal.  Musculoskeletal: Normal range of motion.  Neurological: She is alert and oriented to person, place, and time.  Skin: Skin is warm and dry.  Psychiatric: She has a normal mood and affect. Her behavior is normal. Judgment and thought content normal.  Nursing note and vitals reviewed.         Assessment & Plan:   Menorrhagia with irregular cycle  Encounter for initial prescription of contraceptive pills No Follow-up on file.

## 2015-04-16 NOTE — Patient Instructions (Addendum)
Great to see you. Happy Holidays!  Please keep me updated.  Please call Dr. Gabriel Carina to follow your pituitary.

## 2015-04-16 NOTE — Assessment & Plan Note (Signed)
>  15 minutes spent in face to face time with patient, >50% spent in counselling or coordination of care  Change OCP to ortho novum (higher in estrogen) for breakthrough bleeding.  Call or return to clinic prn if these symptoms worsen or fail to improve as anticipated. The patient indicates understanding of these issues and agrees with the plan.

## 2015-04-16 NOTE — Progress Notes (Signed)
Pre visit review using our clinic review tool, if applicable. No additional management support is needed unless otherwise documented below in the visit note. 

## 2015-10-01 ENCOUNTER — Ambulatory Visit (INDEPENDENT_AMBULATORY_CARE_PROVIDER_SITE_OTHER): Payer: BC Managed Care – PPO | Admitting: *Deleted

## 2015-10-01 DIAGNOSIS — Z111 Encounter for screening for respiratory tuberculosis: Secondary | ICD-10-CM

## 2015-10-03 ENCOUNTER — Other Ambulatory Visit: Payer: Self-pay | Admitting: Internal Medicine

## 2015-10-03 ENCOUNTER — Ambulatory Visit (INDEPENDENT_AMBULATORY_CARE_PROVIDER_SITE_OTHER)
Admission: RE | Admit: 2015-10-03 | Discharge: 2015-10-03 | Disposition: A | Discharge status: Home / Self Care | Payer: BC Managed Care – PPO | Source: Ambulatory Visit | Attending: Internal Medicine | Admitting: Internal Medicine

## 2015-10-03 DIAGNOSIS — R7611 Nonspecific reaction to tuberculin skin test without active tuberculosis: Secondary | ICD-10-CM

## 2015-10-03 LAB — TB SKIN TEST
INDURATION: 20 mm
TB Skin Test: POSITIVE

## 2015-10-06 ENCOUNTER — Telehealth: Payer: Self-pay | Admitting: Family Medicine

## 2015-10-06 NOTE — Telephone Encounter (Signed)
Mom called for results of chest xray.  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  Please call when note ready for pickup [phone]

## 2015-10-06 NOTE — Telephone Encounter (Signed)
Chest xray is normal. Ok for work note.

## 2015-10-06 NOTE — Telephone Encounter (Signed)
Pt is aware--letter placed in front office for pick up

## 2015-10-13 ENCOUNTER — Encounter: Payer: Self-pay | Admitting: Internal Medicine

## 2015-10-13 ENCOUNTER — Ambulatory Visit (INDEPENDENT_AMBULATORY_CARE_PROVIDER_SITE_OTHER): Payer: BC Managed Care – PPO | Admitting: Internal Medicine

## 2015-10-13 VITALS — BP 118/66 | HR 68 | Temp 98.2°F | Ht 66.0 in | Wt 222.4 lb

## 2015-10-13 DIAGNOSIS — W57XXXA Bitten or stung by nonvenomous insect and other nonvenomous arthropods, initial encounter: Secondary | ICD-10-CM

## 2015-10-13 DIAGNOSIS — T148 Other injury of unspecified body region: Secondary | ICD-10-CM | POA: Diagnosis not present

## 2015-10-13 NOTE — Progress Notes (Signed)
Pre visit review using our clinic review tool, if applicable. No additional management support is needed unless otherwise documented below in the visit note. 

## 2015-10-13 NOTE — Progress Notes (Signed)
   Subjective:    Patient ID: Diane Moss, female    DOB: Aug 21, 1995, 20 y.o.   MRN: [id number]  DOS:  10/13/2015 Type of visit - description : acute Interval history: This AM she found a tick on her upper back, took it off, has it with her: tick is reddish, 2 mm, not engorged. She is concerned about Lyme disease. She goes to college at [employer] but she has been in [employer] the last 3 or 4 weeks.    Review of Systems Denies fever, chills No aches or pains No headaches No rash Mild fatigue on and off for the last 2 weeks  Past Medical History  Diagnosis Date  . Acne   . Obesity   . Anxiety   . Headache(784.0)     History reviewed. No pertinent past surgical history.  Social History   Social History  . Marital Status: Single    Spouse Name: N/A  . Number of Children: N/A  . Years of Education: N/A   Occupational History  . Not on file.   Social History Main Topics  . Smoking status: Never Smoker   . Smokeless tobacco: Never Used  . Alcohol Use: No  . Drug Use: No  . Sexual Activity: Not on file   Other Topics Concern  . Not on file   Social History Narrative   Lives with mom and brother.  Parents are divorced but dad is still involved in her life.  He lives close by.  Never been sexually active.  Wants to be a Pharmacist, hospital and a Geophysicist/field seismologist.        Medication List       This list is accurate as of: 10/13/15  1:01 PM.  Always use your most recent med list.               norethindrone-ethinyl estradiol 0.5/0.75/1-35 MG-MCG tablet  Commonly known as:  CYCLAFEM,ALYACEN  Take 1 tablet by mouth daily.           Objective:   Physical Exam BP 118/66 mmHg  Pulse 68  Temp(Src) 98.2 F (36.8 C) (Oral)  Ht 5\' 6"  (1.676 m)  Wt 222 lb 6 oz (100.869 kg)  BMI 35.91 kg/m2  SpO2 98%  LMP 09/26/2015 General:   Well developed, well nourished . NAD.  HEENT:  Normocephalic . Face symmetric, atraumatic   Skin: Not pale. Not jaundice.  No rash Neurologic:  alert & oriented X3.  Speech normal, gait appropriate for age and unassisted Psych--  Cognition and judgment appear intact.  Cooperative with normal attention span and concentration.  Behavior appropriate. No anxious or depressed appearing.      Assessment & Plan:   Tick bite: Tick bite as described above, very low suspicion for a rickettsial disease. Encouraged to call me if she develops symptoms, see AVS

## 2015-10-13 NOTE — Patient Instructions (Signed)
If in the next few weeks,  you develop any symptoms such as a headache, rash, severe lack of energy, aches or pains, fever or chills: Please call the office, will need to do blood work or give the antibiotics

## 2015-10-22 ENCOUNTER — Ambulatory Visit (INDEPENDENT_AMBULATORY_CARE_PROVIDER_SITE_OTHER): Payer: BC Managed Care – PPO | Admitting: Family Medicine

## 2015-10-22 ENCOUNTER — Encounter: Payer: Self-pay | Admitting: Family Medicine

## 2015-10-22 VITALS — BP 118/76 | HR 88 | Temp 98.6°F | Ht 66.0 in | Wt 223.5 lb

## 2015-10-22 DIAGNOSIS — J029 Acute pharyngitis, unspecified: Secondary | ICD-10-CM

## 2015-10-22 DIAGNOSIS — J069 Acute upper respiratory infection, unspecified: Secondary | ICD-10-CM

## 2015-10-22 LAB — POCT RAPID STREP A (OFFICE): Rapid Strep A Screen: NEGATIVE

## 2015-10-22 NOTE — Progress Notes (Signed)
Dr. Frederico Hamman T. Bellarae Lizer, MD, [facility] [place], [address] Phone: [phone] Fax: [phone]  10/22/2015  Patient: Diane Moss, MRN: [id number], DOB: 1995/06/16, 20 y.o.  Primary Physician:  Arnette Norris, MD   Chief Complaint  Patient presents with  . Sore Throat   Subjective:   This 20 y.o. female patient presents with runny nose, sneezing, cough, sore throat, malaise and minimal / low-grade fever .  Working at [employer] - + exposure to strep.  Had some aches and stomache and headache.  + recent exposure to others with similar symptoms.   Denies sthortness of breath/wheezing, high fever, chest pain, rhinits for more than 14 days, significant myalgia, otalgia, facial pain, abdominal pain, changes in bowel or bladder.  PMH, PHS, Allergies, Problem List, Medications, Family History, and Social History have all been reviewed.  Patient Active Problem List   Diagnosis Date Noted  . Contraception management 04/16/2015  . Mid back pain on left side 10/10/2014  . History of nephrolithiasis 10/10/2014  . Menorrhagia 04/24/2014  . Depression 02/16/2013  . Pituitary microadenoma ([place]) 01/08/2011  . Amenorrhea 01/04/2011  . CHILDHOOD OBESITY 02/20/2009    Past Medical History  Diagnosis Date  . Acne   . Obesity   . Anxiety   . Headache(784.0)     No past surgical history on file.  Social History   Social History  . Marital Status: Single    Spouse Name: N/A  . Number of Children: N/A  . Years of Education: N/A   Occupational History  . Not on file.   Social History Main Topics  . Smoking status: Never Smoker   . Smokeless tobacco: Never Used  . Alcohol Use: No  . Drug Use: No  . Sexual Activity: Not on file   Other Topics Concern  . Not on file   Social History Narrative   Lives with mom and brother.  Parents are divorced but dad is still involved in her life.  He lives close by.  Never been  sexually active.  Wants to be a Pharmacist, hospital and a Geophysicist/field seismologist.    Family History  Problem Relation Age of Onset  . Cancer Mother 30    cervial and ovarian  . Diabetes Other   . Hyperlipidemia Other   . Hypertension Other     Allergies  Allergen Reactions  . Peach Flavor Swelling and Rash    Medication list reviewed and updated in full in [place].  ROS as above, eating and drinking - tolerating PO. Urinating normally. No excessive vomitting or diarrhea. O/w as above.  Objective:   Blood pressure 118/76, pulse 88, temperature 98.6 F (37 C), temperature source Oral, height 5\' 6"  (1.676 m), weight 223 lb 8 oz (101.379 kg), last menstrual period 09/26/2015.  GEN: WDWN, Non-toxic, Atraumatic, normocephalic. A and O x 3. HEENT: Oropharynx clear without exudate, MMM, no significant LAD, mild rhinnorhea Ears: TM clear, COL visualized with good landmarks CV: RRR, no m/g/r. Pulm: CTA B, no wheezes, rhonchi, or crackles, normal respiratory effort. EXT: no c/c/e Psych: well oriented, neither depressed nor anxious in appearance  Objective Data: Results for orders placed or performed in visit on 10/22/15  POCT rapid strep A  Result Value Ref Range   Rapid Strep A Screen Negative Negative    Assessment and Plan:   URI (upper respiratory infection)  Sore throat - Plan: POCT rapid strep A  Supportive care reviewed  with patient. See patient instruction section.  Follow-up: No Follow-up on file.  Orders Placed This Encounter  Procedures  . POCT rapid strep A    Signed,  Mailyn Steichen T. Francyne Arreaga, MD   Patient's Medications  New Prescriptions   No medications on file  Previous Medications   NORETHINDRONE-ETHINYL ESTRADIOL (CYCLAFEM,ALYACEN) 0.5/0.75/1-35 MG-MCG TABLET    Take 1 tablet by mouth daily.  Modified Medications   No medications on file  Discontinued Medications   No medications on file

## 2015-10-22 NOTE — Progress Notes (Signed)
Pre visit review using our clinic review tool, if applicable. No additional management support is needed unless otherwise documented below in the visit note. 

## 2015-10-25 IMAGING — CT CT ABD-PELV W/ CM
2 of 4 series · 16 of 46 positions shown, 18 images · IV contrast (Omni 300)
Comparison: Abdominal plain film 11/13/2013, 10/25/2013

CLINICAL DATA: 18-year-old female with nausea and chest/epigastric
pain

EXAM:
CT ABDOMEN AND PELVIS WITH CONTRAST
TECHNIQUE: Multidetector CT imaging of the abdomen and pelvis was performed
using the standard protocol following bolus administration of
intravenous contrast.
CONTRAST:  80mL OMNIPAQUE IOHEXOL 300 MG/ML  SOLN

[Series 2: abd/ pelvis 5.0 i30f 1 · axial · 0.64mm/px · z∈[-488,-33]mm · 13 of 99 slices shown, 15 images]
[im 4/99  soft-tissue]
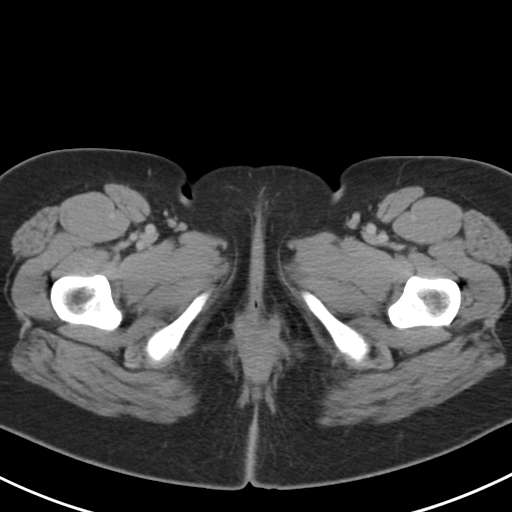
[im 4/99  bone]
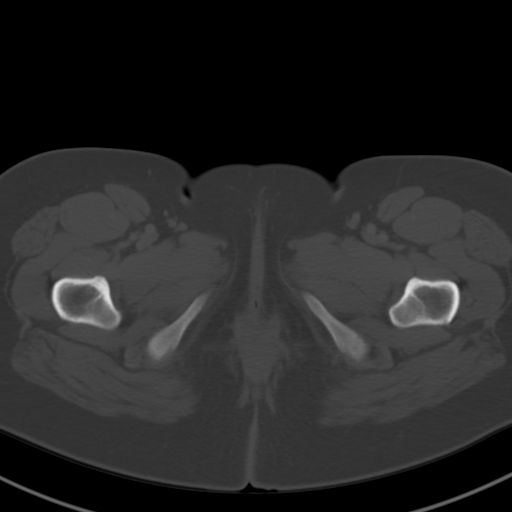
[im 12/99  soft-tissue]
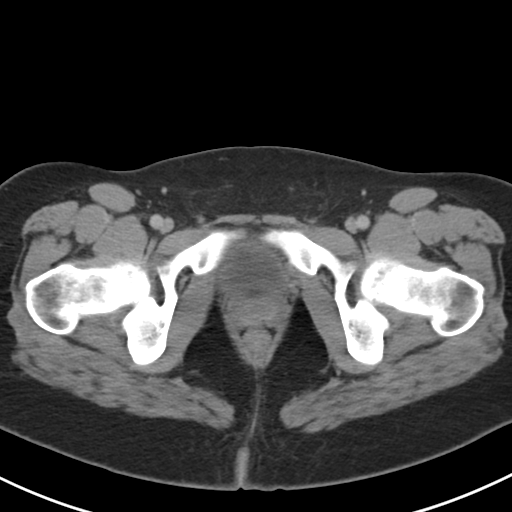
[im 20/99  soft-tissue]
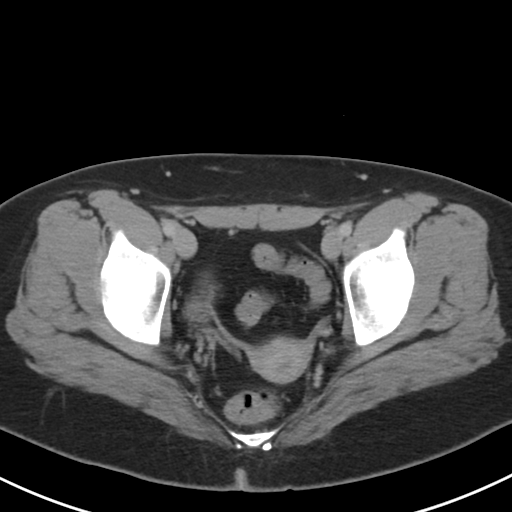
[im 28/99  soft-tissue]
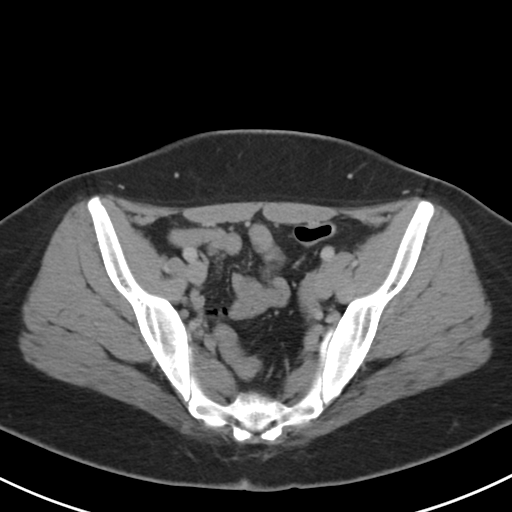
[im 36/99  soft-tissue]
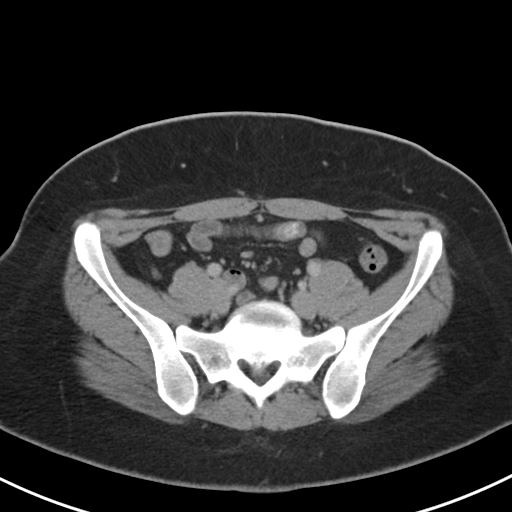
[im 44/99  soft-tissue]
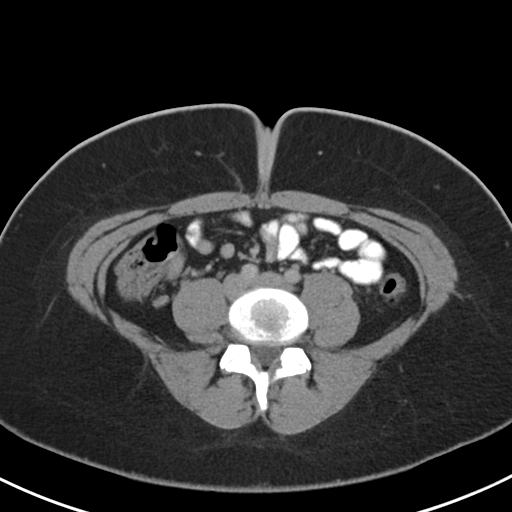
[im 51/99  soft-tissue]
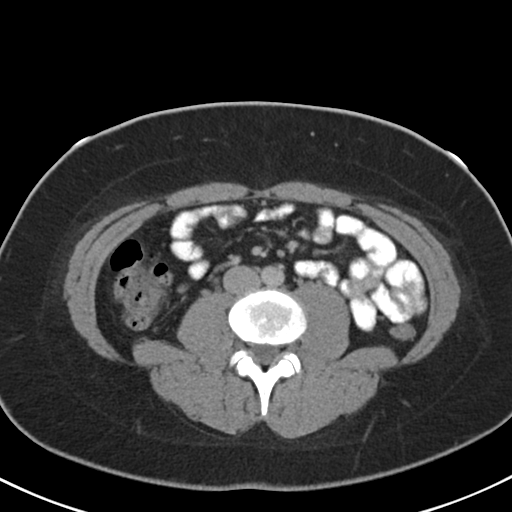
[im 55/99  soft-tissue]
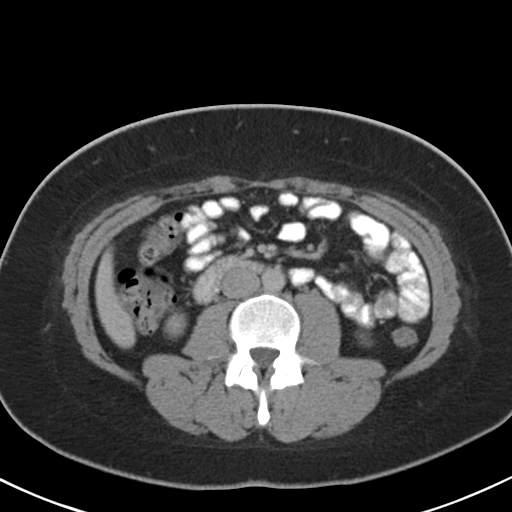
[im 63/99  soft-tissue]
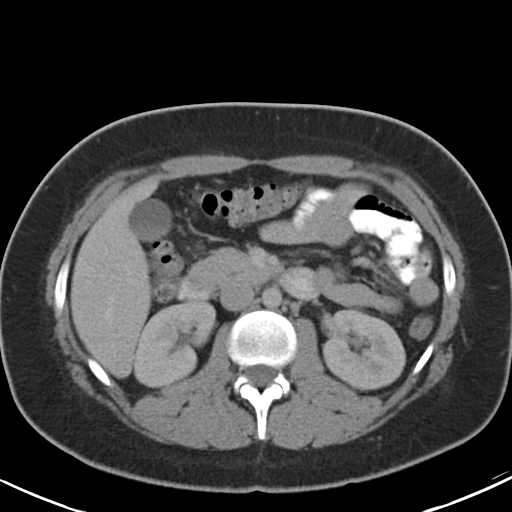
[im 63/99  bone]
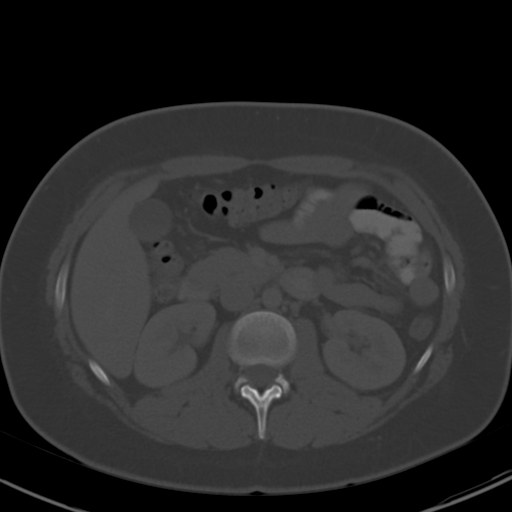
[im 71/99  soft-tissue]
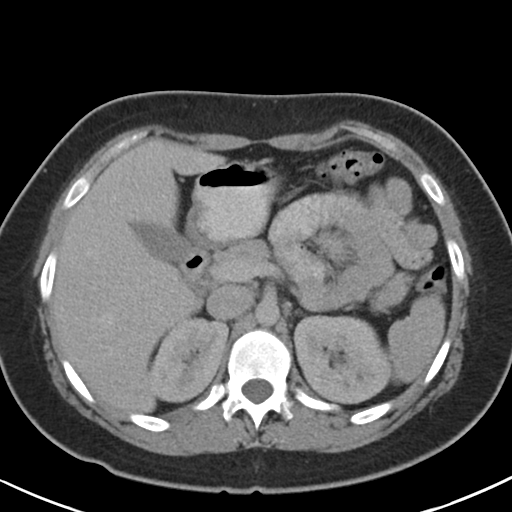
[im 79/99  soft-tissue]
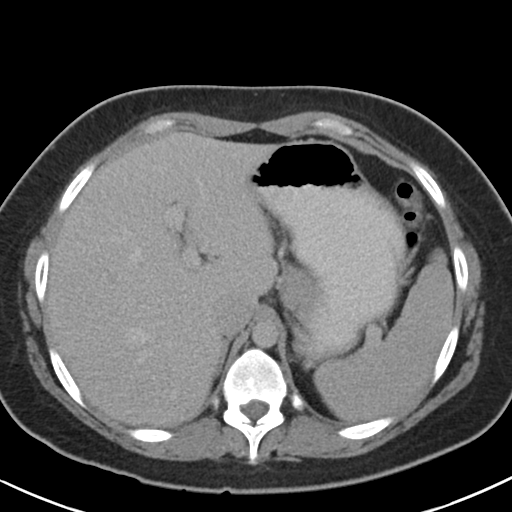
[im 87/99  soft-tissue]
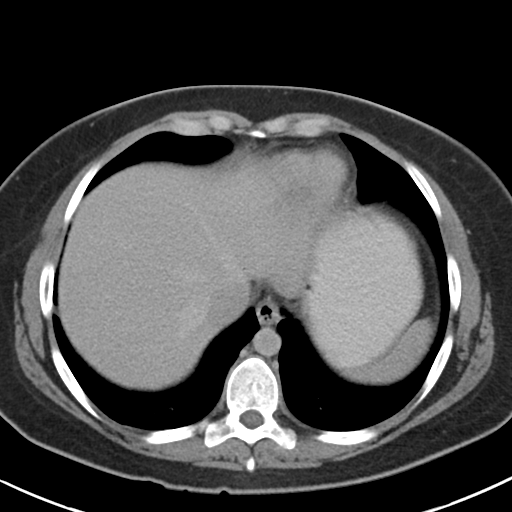
[im 95/99  soft-tissue]
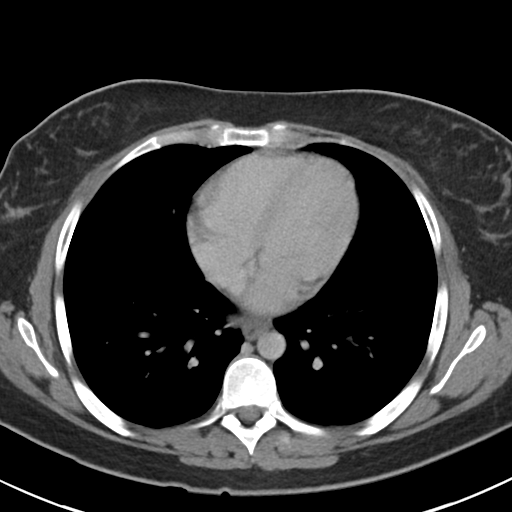

[Series 5: coronals · coronal · 0.70mm/px · 3 of 103 slices shown]
[im 35/103  soft-tissue]
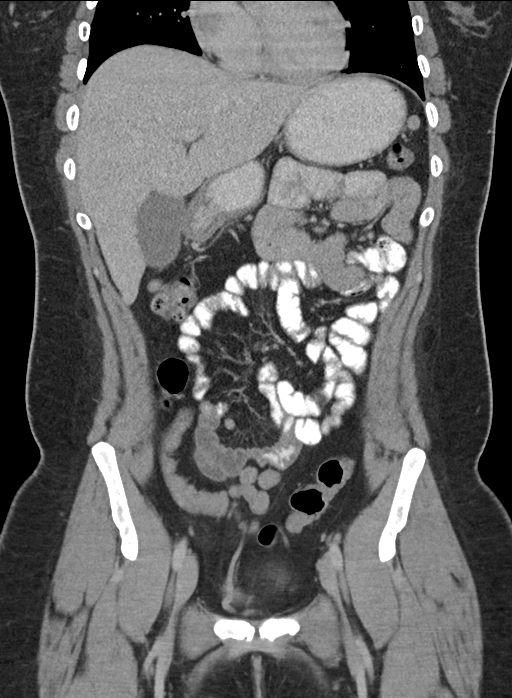
[im 46/103  soft-tissue]
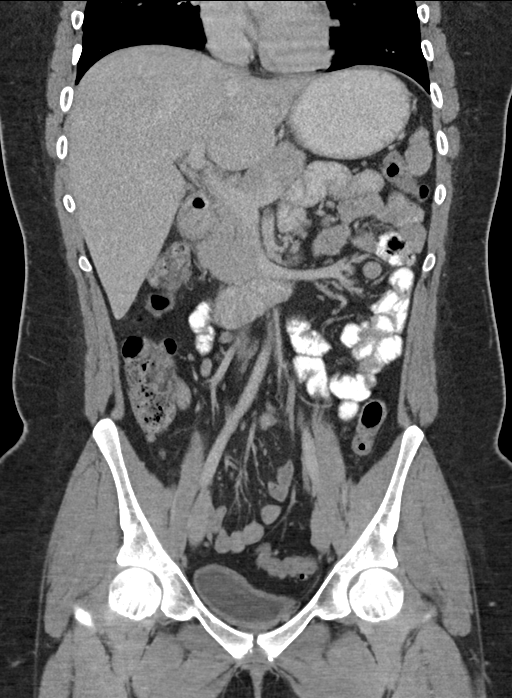
[im 57/103  soft-tissue]
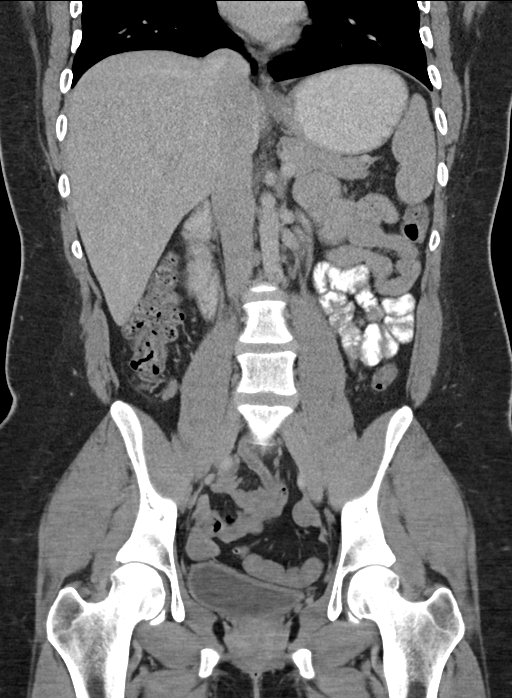

[16 of 46 positions shown; findings below may reference images not displayed]

FINDINGS: Lower chest:

Unremarkable appearance of the soft tissues.

Heart size within normal limits.  No pericardial fluid/thickening.

No lower mediastinal adenopathy.

No confluent airspace disease, visualized pneumothorax, or pleural
effusion.

Abdomen/pelvis:

Unremarkable appearance of the liver and spleen. Unremarkable
appearance of bilateral adrenal glands.

No pericholecystic or peripancreatic fluid or inflammatory changes.

Differential attenuation of the fluid within the gallbladder lumen,
which may represent a biliary sludge.

No intrahepatic or extrahepatic biliary ductal dilatation.

Unremarkable appearance the bilateral kidneys without
nephrolithiasis or hydronephrosis. No ureteral stones identified.

No free intraperitoneal air.  No significant free fluid.

No abnormal distension of small bowel or colon. No transition point.
Enteric contrast present within small bowel. Normal appendix.

Unremarkable appearance of uterus and adnexa.

Unremarkable appearance of the vasculature with no significant
atherosclerotic disease.

No displaced fracture.
IMPRESSION: No acute CT findings to account for the patient's complaints of
abdominal pain and nausea.

## 2015-12-23 ENCOUNTER — Encounter: Payer: Self-pay | Admitting: Family Medicine

## 2015-12-23 ENCOUNTER — Ambulatory Visit (INDEPENDENT_AMBULATORY_CARE_PROVIDER_SITE_OTHER): Payer: BC Managed Care – PPO | Admitting: Family Medicine

## 2015-12-23 VITALS — BP 124/66 | HR 78 | Temp 98.2°F | Ht 66.25 in | Wt 223.0 lb

## 2015-12-23 DIAGNOSIS — N921 Excessive and frequent menstruation with irregular cycle: Secondary | ICD-10-CM | POA: Diagnosis not present

## 2015-12-23 DIAGNOSIS — Z87442 Personal history of urinary calculi: Secondary | ICD-10-CM

## 2015-12-23 DIAGNOSIS — M549 Dorsalgia, unspecified: Secondary | ICD-10-CM

## 2015-12-23 DIAGNOSIS — Z0001 Encounter for general adult medical examination with abnormal findings: Secondary | ICD-10-CM | POA: Diagnosis not present

## 2015-12-23 DIAGNOSIS — Z01419 Encounter for gynecological examination (general) (routine) without abnormal findings: Secondary | ICD-10-CM

## 2015-12-23 DIAGNOSIS — R5383 Other fatigue: Secondary | ICD-10-CM | POA: Diagnosis not present

## 2015-12-23 LAB — LIPID PANEL
Cholesterol: 186 mg/dL (ref 0–200)
HDL: 71.9 mg/dL (ref >39.00)
LDL Cholesterol: 94 mg/dL (ref 0–99)
NonHDL: 113.74
Total CHOL/HDL Ratio: 3
Triglycerides: 100 mg/dL (ref 0.0–149.0)
VLDL: 20 mg/dL (ref 0.0–40.0)

## 2015-12-23 LAB — CBC WITH DIFFERENTIAL/PLATELET
BASOS PCT: 0.5 % (ref 0.0–3.0)
Basophils Absolute: 0 10*3/uL (ref 0.0–0.1)
EOS ABS: 0.2 10*3/uL (ref 0.0–0.7)
Eosinophils Relative: 1.9 % (ref 0.0–5.0)
HCT: 38.4 % (ref 36.0–46.0)
Hemoglobin: 12.8 g/dL (ref 12.0–15.0)
Lymphocytes Relative: 29.4 % (ref 12.0–46.0)
Lymphs Abs: 2.7 10*3/uL (ref 0.7–4.0)
MCHC: 33.3 g/dL (ref 30.0–36.0)
MCV: 84.3 fl (ref 78.0–100.0)
Monocytes Absolute: 0.5 10*3/uL (ref 0.1–1.0)
Monocytes Relative: 5.2 % (ref 3.0–12.0)
NEUTROS ABS: 5.7 10*3/uL (ref 1.4–7.7)
Neutrophils Relative %: 63 % (ref 43.0–77.0)
PLATELETS: 362 10*3/uL (ref 150.0–400.0)
RBC: 4.55 Mil/uL (ref 3.87–5.11)
RDW: 13.5 % (ref 11.5–15.5)
WBC: 9.1 10*3/uL (ref 4.0–10.5)

## 2015-12-23 LAB — POC URINALSYSI DIPSTICK (AUTOMATED)
BILIRUBIN UA: NEGATIVE
Glucose, UA: NEGATIVE
Ketones, UA: NEGATIVE
Leukocytes, UA: NEGATIVE
Nitrite, UA: NEGATIVE
Protein, UA: NEGATIVE
RBC UA: NEGATIVE
Spec Grav, UA: >=1.03
Urobilinogen, UA: 0.2
pH, UA: 6.5

## 2015-12-23 LAB — TSH: TSH: 2.24 u[IU]/mL (ref 0.35–4.50)

## 2015-12-23 LAB — COMPREHENSIVE METABOLIC PANEL
ALT: 15 U/L (ref 0–35)
AST: 15 U/L (ref 0–37)
Albumin: 3.9 g/dL (ref 3.5–5.2)
Alkaline Phosphatase: 53 U/L (ref 39–117)
BUN: 9 mg/dL (ref 6–23)
CHLORIDE: 104 meq/L (ref 96–112)
CO2: 25 meq/L (ref 19–32)
Calcium: 9.4 mg/dL (ref 8.4–10.5)
Creatinine, Ser: 0.7 mg/dL (ref 0.40–1.20)
GFR: 110.7 mL/min (ref >60.00)
Glucose, Bld: 83 mg/dL (ref 70–99)
Potassium: 4.3 mEq/L (ref 3.5–5.1)
Sodium: 137 mEq/L (ref 135–145)
Total Bilirubin: 0.3 mg/dL (ref 0.2–1.2)
Total Protein: 7.4 g/dL (ref 6.0–8.3)

## 2015-12-23 LAB — VITAMIN D 25 HYDROXY (VIT D DEFICIENCY, FRACTURES): VITD: 43.53 ng/mL (ref 30.00–100.00)

## 2015-12-23 LAB — VITAMIN B12: VITAMIN B 12: 367 pg/mL (ref 211–911)

## 2015-12-23 NOTE — Assessment & Plan Note (Signed)
Likely multifactorial. Check labs today to rule out other possible contributing factors. The patient indicates understanding of these issues and agrees with the plan.

## 2015-12-23 NOTE — Patient Instructions (Signed)
Great to see you. We will call you with your lab results.  Please schedule a pap smear at your convenience.

## 2015-12-23 NOTE — Addendum Note (Signed)
Addended by: Modena Nunnery on: 12/23/2015 11:32 AM   Modules accepted: Orders

## 2015-12-23 NOTE — Progress Notes (Signed)
Pre visit review using our clinic review tool, if applicable. No additional management support is needed unless otherwise documented below in the visit note. 

## 2015-12-23 NOTE — Assessment & Plan Note (Signed)
Likely MSK. UA neg Advised NSAIDs/heating pad prn. Call or return to clinic prn if these symptoms worsen or fail to improve as anticipated. The patient indicates understanding of these issues and agrees with the plan.

## 2015-12-23 NOTE — Assessment & Plan Note (Signed)
Symptoms controlled with current OCPs. No changes made to rxs today.

## 2015-12-23 NOTE — Progress Notes (Signed)
Subjective:   Patient ID: Diane Moss, female    DOB: 16-Jan-1996, 20 y.o.   MRN: [id number]  Diane Moss is a pleasant 20 y.o. year old female who presents to clinic today with Annual Exam; Back Pain; and Fatigue  on 12/23/2015  HPI:  Doing well.  On OCPs for menorrhagia- not sexually active, virginal.  Left sided back pain- thinks its likely MSK.  Working at a daycare now and picking up toddlers. She wanted to make sure it is not another kidney stone. Pain is not as severe and not radiating toward her groin.  No hematuria. No nausea or vomiting.  Has been more tired lately.  She has been working more hours but feels she gets enough sleep.  No CP or SOB.  Periods are now light since she has been on OCPs.  Denies feeling depressed.   Lab Results  Component Value Date   CHOL 156 02/12/2013   HDL 48.30 02/12/2013   LDLCALC 84 02/12/2013   TRIG 117.0 02/12/2013   CHOLHDL 3 02/12/2013   Lab Results  Component Value Date   CREATININE 0.64 02/13/2014   Lab Results  Component Value Date   TSH 3.31 02/12/2013   Lab Results  Component Value Date   WBC 6.7 02/13/2014   HGB 12.7 02/13/2014   HCT 37.8 02/13/2014   MCV 84.0 02/13/2014   PLT 340 02/13/2014     Review of Systems  Constitutional: Positive for fatigue.  HENT: Negative.   Eyes: Negative.   Respiratory: Negative.   Cardiovascular: Negative.   Gastrointestinal: Negative.   Endocrine: Negative.   Genitourinary: Negative.   Musculoskeletal: Positive for back pain.  Allergic/Immunologic: Negative.   Neurological: Negative.   Hematological: Negative.   Psychiatric/Behavioral: Negative.   All other systems reviewed and are negative.      Objective:    BP 124/66   Pulse 78   Temp 98.2 F (36.8 C) (Oral)   Ht 5' 6.25" (1.683 m)   Wt 223 lb (101.2 kg)   LMP 12/04/2015   SpO2 99%   BMI 35.72 kg/m    Physical Exam  General:  Obese,well-nourished,in no acute distress; alert,appropriate  and cooperative throughout examination Head:  normocephalic and atraumatic.   Eyes:  vision grossly intact, pupils equal, pupils round, and pupils reactive to light.   Ears:  R ear normal and L ear normal.   Nose:  no external deformity.   Mouth:  good dentition.   Neck:  No deformities, masses, or tenderness noted. Lungs:  Normal respiratory effort, chest expands symmetrically. Lungs are clear to auscultation, no crackles or wheezes. Heart:  Normal rate and regular rhythm. S1 and S2 normal without gallop, murmur, click, rub or other extra sounds. Abdomen:  Bowel sounds positive,abdomen soft and non-tender without masses, organomegaly or hernias noted. Msk:  No deformity or scoliosis noted of thoracic or lumbar spine.   Extremities:  No clubbing, cyanosis, edema, or deformity noted with normal full range of motion of all joints.   Neurologic:  alert & oriented X3 and gait normal.   Skin:  Intact without suspicious lesions or rashes Cervical Nodes:  No lymphadenopathy noted Axillary Nodes:  No palpable lymphadenopathy Psych:  Cognition and judgment appear intact. Alert and cooperative with normal attention span and concentration. No apparent delusions, illusions, hallucinations      Assessment & Plan:   Well woman exam - Plan: CBC with Differential/Platelet, Comprehensive metabolic panel, Lipid panel, TSH  Other fatigue - Plan:  Vitamin B12, Vitamin D, 25-hydroxy No Follow-up on file.

## 2015-12-23 NOTE — Assessment & Plan Note (Signed)
Reviewed preventive care protocols, scheduled due services, and updated immunizations Discussed nutrition, exercise, diet, and healthy lifestyle.  She declined pap smear today.  Agrees to labs.  Orders Placed This Encounter  Procedures  . CBC with Differential/Platelet  . Comprehensive metabolic panel  . Lipid panel  . TSH  . Vitamin B12  . Vitamin D, 25-hydroxy

## 2015-12-24 ENCOUNTER — Encounter: Payer: Self-pay | Admitting: *Deleted

## 2016-03-18 ENCOUNTER — Other Ambulatory Visit: Payer: Self-pay | Admitting: Family Medicine

## 2016-04-14 ENCOUNTER — Other Ambulatory Visit: Payer: Self-pay | Admitting: *Deleted

## 2016-04-14 MED ORDER — NORETHIN-ETH ESTRAD TRIPHASIC 0.5/0.75/1-35 MG-MCG PO TABS: 1.0000 | ORAL_TABLET | Freq: Every day | ORAL | 3 refills | Status: DC

## 2017-02-23 ENCOUNTER — Telehealth: Payer: Self-pay | Admitting: Family Medicine

## 2017-02-23 DIAGNOSIS — R7611 Nonspecific reaction to tuberculin skin test without active tuberculosis: Secondary | ICD-10-CM

## 2017-02-23 NOTE — Telephone Encounter (Signed)
Okay to schedule cxr

## 2017-02-23 NOTE — Telephone Encounter (Signed)
Caller Name:ann Baehr Relationship to Patient:mother Best number:[phone] Pharmacy:  Reason for call:  Pt tested positive for tb at college.  She has been told to have a chest xray. 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 Pt will be in town this week and would like to have xray done here at that time. Please call mother.

## 2017-02-23 NOTE — Telephone Encounter (Signed)
cxr ordered,  I notified patients mother, she will be in on Friday

## 2017-02-25 ENCOUNTER — Ambulatory Visit (INDEPENDENT_AMBULATORY_CARE_PROVIDER_SITE_OTHER)
Admission: RE | Admit: 2017-02-25 | Discharge: 2017-02-25 | Disposition: A | Discharge status: Home / Self Care | Payer: Self-pay | Source: Ambulatory Visit | Attending: Family Medicine | Admitting: Family Medicine

## 2017-02-25 ENCOUNTER — Ambulatory Visit
Admission: RE | Admit: 2017-02-25 | Discharge: 2017-02-25 | Disposition: A | Discharge status: Home / Self Care | Payer: BC Managed Care – PPO | Source: Ambulatory Visit | Attending: Family Medicine | Admitting: Family Medicine

## 2017-02-25 ENCOUNTER — Other Ambulatory Visit: Payer: Self-pay

## 2017-02-25 DIAGNOSIS — R7611 Nonspecific reaction to tuberculin skin test without active tuberculosis: Secondary | ICD-10-CM

## 2017-02-25 NOTE — Progress Notes (Signed)
Pt had positive TB test at school. Dr. Deborra Medina placed CXR order on 02/23/17; 2 View was ordered.  Received verbal order from Clarene Reamer to change order to 1 View CXR.

## 2017-02-28 ENCOUNTER — Telehealth: Payer: Self-pay | Admitting: *Deleted

## 2017-02-28 ENCOUNTER — Encounter: Payer: Self-pay | Admitting: *Deleted

## 2017-02-28 NOTE — Telephone Encounter (Signed)
Yes okay to write letter as requested. 

## 2017-02-28 NOTE — Telephone Encounter (Signed)
Patient's mother would like to know if there is a letter she can have stating it is ok for Diane Moss to be around other students at school?

## 2017-02-28 NOTE — Telephone Encounter (Signed)
Patient notified, letter will be at front to be picked up.

## 2017-03-02 NOTE — Telephone Encounter (Signed)
Ok to write letter as requested- her CXR was negative and therefore she does not have TB and can be around children.

## 2017-03-02 NOTE — Telephone Encounter (Signed)
Pt mother, Diane Moss, called. The previous letter was not enough for her work. She needs the letter to state the x-ray was clear and she does not TB and she can be around children. 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 Mother is confused about this. Please advise and call with any questions and when letter is ready for pick up.

## 2017-03-03 NOTE — Telephone Encounter (Signed)
Document created printed and signed/pt aware it is ready at the front/thx dmf

## 2017-05-05 ENCOUNTER — Other Ambulatory Visit: Payer: Self-pay | Admitting: Family Medicine

## 2017-07-25 ENCOUNTER — Encounter: Payer: Self-pay | Admitting: Family Medicine

## 2017-07-25 ENCOUNTER — Ambulatory Visit (INDEPENDENT_AMBULATORY_CARE_PROVIDER_SITE_OTHER): Payer: BLUE CROSS/BLUE SHIELD | Admitting: Family Medicine

## 2017-07-25 VITALS — BP 124/88 | HR 95 | Temp 98.1°F | Ht 67.0 in | Wt 248.0 lb

## 2017-07-25 DIAGNOSIS — R519 Headache, unspecified: Secondary | ICD-10-CM

## 2017-07-25 DIAGNOSIS — R51 Headache: Secondary | ICD-10-CM

## 2017-07-25 DIAGNOSIS — G8929 Other chronic pain: Secondary | ICD-10-CM

## 2017-07-25 MED ORDER — SUMATRIPTAN SUCCINATE 25 MG PO TABS: 25.0000 mg | ORAL_TABLET | ORAL | 0 refills | Status: DC | PRN

## 2017-07-25 MED ORDER — FLUCONAZOLE 150 MG PO TABS: 150.0000 mg | ORAL_TABLET | Freq: Once | ORAL | 0 refills | Status: AC

## 2017-07-25 NOTE — Assessment & Plan Note (Signed)
Likely having more than one type of headache- tension with some migraines as well. No red flag symptoms. Advised getting an eye exam to make sure headaches aren't being caused by eye strain. She will keep a headache journal. eRx sent for imitrex to use prn migraine headache- discussed sedation precautions and pregnancy category of RX. The patient indicates understanding of these issues and agrees with the plan.

## 2017-07-25 NOTE — Progress Notes (Signed)
Subjective:   Patient ID: Diane Moss, female    DOB: 27-Jan-1996, 22 y.o.   MRN: [id number]  Aura Bibby is a pleasant 22 y.o. year old female who presents to clinic today with Headache (Patient is here today C/O headaches that have been present almost daily since January.  States that it is frontal and temporal with light and sound sensitivity and intermittent nausea.  Woke this am with a dull H/A but has since gone.  )  on 07/25/2017  HPI:  Headaches- almost daily headaches for past two months, since she started a new semester. Most are frontal and dull. But a few times per month, headaches are severe and associated with light/sound sensitivity and nausea. No other neurological symptoms.  She has been taking Ibuprofen but not daily.  Only if headaches are severe.  This does seem to dull the headache. Going to sleep also helps.    Current Outpatient Medications on File Prior to Visit  Medication Sig Dispense Refill  . NORTREL 7/7/7 0.5/0.75/1-35 MG-MCG tablet TAKE 1 TABLET BY MOUTH DAILY. 84 tablet 0   No current facility-administered medications on file prior to visit.     Allergies  Allergen Reactions  . Peach Flavor Swelling and Rash    Past Medical History:  Diagnosis Date  . Acne   . Anxiety   . Headache(784.0)   . Obesity     No past surgical history on file.  Family History  Problem Relation Age of Onset  . Cancer Mother 30       cervial and ovarian  . Diabetes Other   . Hyperlipidemia Other   . Hypertension Other     Social History   Socioeconomic History  . Marital status: Single    Spouse name: Not on file  . Number of children: Not on file  . Years of education: Not on file  . Highest education level: Not on file  Social Needs  . Financial resource strain: Not on file  . Food insecurity - worry: Not on file  . Food insecurity - inability: Not on file  . Transportation needs - medical: Not on file  . Transportation needs -  non-medical: Not on file  Occupational History  . Not on file  Tobacco Use  . Smoking status: Never Smoker  . Smokeless tobacco: Never Used  Substance and Sexual Activity  . Alcohol use: No    Alcohol/week: 0.0 oz  . Drug use: No  . Sexual activity: Not on file  Other Topics Concern  . Not on file  Social History Narrative   Lives with mom and brother.  Parents are divorced but dad is still involved in her life.  He lives close by.  Never been sexually active.  Wants to be a Pharmacist, hospital and a Geophysicist/field seismologist.   The PMH, PSH, Social History, Family History, Medications, and allergies have been reviewed in [facility], and have been updated if relevant.   Review of Systems  Constitutional: Negative.   HENT: Negative.   Eyes: Positive for photophobia.  Respiratory: Negative.   Cardiovascular: Negative.   Gastrointestinal: Positive for nausea. Negative for vomiting.  Neurological: Positive for headaches. Negative for dizziness, tremors, seizures, syncope, facial asymmetry, speech difficulty, weakness, light-headedness and numbness.  All other systems reviewed and are negative.      Objective:    BP 124/88 (BP Location: Left Arm, Patient Position: Sitting, Cuff Size: Normal)   Pulse 95   Temp 98.1 F (36.7 C) (Oral)  Ht 5\' 7"  (1.702 m)   Wt 248 lb (112.5 kg)   LMP 07/13/2017   SpO2 98%   BMI 38.84 kg/m    Physical Exam  Constitutional: She is oriented to person, place, and time. She appears well-developed and well-nourished. No distress.  HENT:  Head: Normocephalic and atraumatic.  Eyes: Conjunctivae are normal.  Cardiovascular: Normal rate.  Pulmonary/Chest: Effort normal.  Musculoskeletal: Normal range of motion.  Neurological: She is alert and oriented to person, place, and time. No cranial nerve deficit.  Skin: Skin is warm and dry. She is not diaphoretic.  Psychiatric: She has a normal mood and affect. Her behavior is normal. Judgment and thought content normal.  Nursing  note and vitals reviewed.         Assessment & Plan:   Chronic nonintractable headache, unspecified headache type No Follow-up on file.

## 2017-07-25 NOTE — Patient Instructions (Signed)
Great to see you.  It seems like you are having more than one type of headache- tension headaches and migraines (those associated with light or sound sensitivity or nausea). Keep a headache journal.  Imitrex as needed for migraines only. Okay to use Ibuprofen.  Please go see an eye doctor.  Please come see me in 1 month.

## 2017-07-31 ENCOUNTER — Other Ambulatory Visit: Payer: Self-pay | Admitting: Family Medicine

## 2017-08-29 ENCOUNTER — Ambulatory Visit (INDEPENDENT_AMBULATORY_CARE_PROVIDER_SITE_OTHER): Payer: BLUE CROSS/BLUE SHIELD | Admitting: Family Medicine

## 2017-08-29 ENCOUNTER — Encounter: Payer: Self-pay | Admitting: Family Medicine

## 2017-08-29 VITALS — BP 126/82 | HR 80 | Temp 98.3°F | Ht 67.0 in | Wt 248.6 lb

## 2017-08-29 DIAGNOSIS — G8929 Other chronic pain: Secondary | ICD-10-CM

## 2017-08-29 DIAGNOSIS — D352 Benign neoplasm of pituitary gland: Secondary | ICD-10-CM | POA: Diagnosis not present

## 2017-08-29 DIAGNOSIS — R51 Headache: Secondary | ICD-10-CM

## 2017-08-29 DIAGNOSIS — G4489 Other headache syndrome: Secondary | ICD-10-CM

## 2017-08-29 DIAGNOSIS — R519 Headache, unspecified: Secondary | ICD-10-CM

## 2017-08-29 MED ORDER — FLUCONAZOLE 150 MG PO TABS
ORAL_TABLET | ORAL | 0 refills | Status: DC
Start: 2017-08-29 — End: 2017-10-25

## 2017-08-29 NOTE — Patient Instructions (Signed)
Great to see you. Please keep your headache journal and we will call you with your MRI appointment. Please get your eyes checked as well.

## 2017-08-29 NOTE — Assessment & Plan Note (Signed)
Continue current plan- rxs and journal. Will repeat MRI- last on in 2015 to re evaluate micro adenoma. The patient indicates understanding of these issues and agrees with the plan. Orders Placed This Encounter  Procedures  . MR Brain W Wo Contrast    Standing Status:   Future    Standing Expiration Date:   10/30/2018    Order Specific Question:   If indicated for the ordered procedure, I authorize the administration of contrast media per Radiology protocol    Answer:   Yes    Order Specific Question:   What is the patient's sedation requirement?    Answer:   No Sedation    Order Specific Question:   Does the patient have a pacemaker or implanted devices?    Answer:   No    Order Specific Question:   Radiology Contrast Protocol - do NOT remove file path    Answer:   [email or web address]    Order Specific Question:   Preferred imaging location?    Answer:   [facility] (table limit-300lbs)  /

## 2017-08-29 NOTE — Progress Notes (Signed)
Subjective:   Patient ID: Diane Moss, female    DOB: February 19, 1996, 22 y.o.   MRN: [id number]  Sahory Nordling is a pleasant 22 y.o. year old female who presents to clinic today with Follow-up (Patient is here today for a 70-month-F/U headaches.  She was seen on 3.4.19 for chronic nonintractable headache.  She was advised to get an eye exam and keep a headache journal.  She was given Imitrex and advised to RTN in 1 month.  She has brought a journal and she has had to take the Imitrex 3 times which worked well.)  on 08/29/2017  HPI:  Headache follow up-  I saw her for this on 07/25/17.  Note reviewed. Based on description of headaches, it seemed she was having different types of headaches- some migrainous, other tension.  Advised her to keep a headache journal, have an eye exam to rule out eye strain related HA. eRx sent for imitrex to use as needed for migrainous headaches.  Here to follow this up today.  Does have a h/o fibroadenoma.  She has not had an eye exam and kept a journal.  Has had to take imitrex 3 times in this past month and it has worked well.  Current Outpatient Medications on File Prior to Visit  Medication Sig Dispense Refill  . NORTREL 7/7/7 0.5/0.75/1-35 MG-MCG tablet TAKE 1 TABLET BY MOUTH EVERY DAY. **PATIENT NEEDS PHYSICAL* 84 tablet 0  . SUMAtriptan (IMITREX) 25 MG tablet Take 1 tablet (25 mg total) by mouth every 2 (two) hours as needed for migraine. May repeat in 2 hours if headache persists or recurs. 10 tablet 0   No current facility-administered medications on file prior to visit.     Allergies  Allergen Reactions  . Peach Flavor Swelling and Rash    Past Medical History:  Diagnosis Date  . Acne   . Anxiety   . Headache(784.0)   . Obesity     No past surgical history on file.  Family History  Problem Relation Age of Onset  . Cancer Mother 30       cervial and ovarian  . Diabetes Other   . Hyperlipidemia Other   . Hypertension Other       Social History   Socioeconomic History  . Marital status: Single    Spouse name: Not on file  . Number of children: Not on file  . Years of education: Not on file  . Highest education level: Not on file  Occupational History  . Not on file  Social Needs  . Financial resource strain: Not on file  . Food insecurity:    Worry: Not on file    Inability: Not on file  . Transportation needs:    Medical: Not on file    Non-medical: Not on file  Tobacco Use  . Smoking status: Never Smoker  . Smokeless tobacco: Never Used  Substance and Sexual Activity  . Alcohol use: No    Alcohol/week: 0.0 oz  . Drug use: No  . Sexual activity: Not on file  Lifestyle  . Physical activity:    Days per week: Not on file    Minutes per session: Not on file  . Stress: Not on file  Relationships  . Social connections:    Talks on phone: Not on file    Gets together: Not on file    Attends religious service: Not on file    Active member of club or organization: Not on  file    Attends meetings of clubs or organizations: Not on file    Relationship status: Not on file  . Intimate partner violence:    Fear of current or ex partner: Not on file    Emotionally abused: Not on file    Physically abused: Not on file    Forced sexual activity: Not on file  Other Topics Concern  . Not on file  Social History Narrative   Lives with mom and brother.  Parents are divorced but dad is still involved in her life.  He lives close by.  Never been sexually active.  Wants to be a Pharmacist, hospital and a Geophysicist/field seismologist.   The PMH, PSH, Social History, Family History, Medications, and allergies have been reviewed in [facility], and have been updated if relevant.   Review of Systems  Constitutional: Negative.   HENT: Negative.   Eyes: Positive for photophobia.  Musculoskeletal: Negative.   Neurological: Positive for headaches. Negative for dizziness, tremors, seizures, syncope, facial asymmetry, speech difficulty, weakness,  light-headedness and numbness.  Hematological: Negative.   Psychiatric/Behavioral: Negative.   All other systems reviewed and are negative.      Objective:    BP 126/82 (BP Location: Left Arm, Patient Position: Sitting, Cuff Size: Normal)   Pulse 80   Temp 98.3 F (36.8 C) (Oral)   Ht 5\' 7"  (1.702 m)   Wt 248 lb 9.6 oz (112.8 kg)   LMP 08/11/2017   SpO2 99%   BMI 38.94 kg/m    Physical Exam  Constitutional: She is oriented to person, place, and time. She appears well-developed and well-nourished. No distress.  HENT:  Head: Normocephalic and atraumatic.  Eyes: Pupils are equal, round, and reactive to light.  Cardiovascular: Normal rate.  Pulmonary/Chest: Effort normal.  Abdominal: Soft.  Neurological: She is alert and oriented to person, place, and time. She displays normal reflexes. No cranial nerve deficit. She exhibits normal muscle tone. Coordination normal.  Skin: Skin is warm and dry. She is not diaphoretic.  Psychiatric: She has a normal mood and affect. Her behavior is normal. Judgment and thought content normal.  Nursing note and vitals reviewed.         Assessment & Plan:   Pituitary microadenoma (HCC)  Chronic nonintractable headache, unspecified headache type  Other headache syndrome - Plan: MR Brain W Wo Contrast No follow-ups on file.

## 2017-08-30 ENCOUNTER — Encounter: Payer: Self-pay | Admitting: Family Medicine

## 2017-09-07 ENCOUNTER — Ambulatory Visit: Payer: BLUE CROSS/BLUE SHIELD

## 2017-10-04 ENCOUNTER — Ambulatory Visit
Admission: RE | Admit: 2017-10-04 | Discharge: 2017-10-04 | Disposition: A | Discharge status: Home / Self Care | Payer: BLUE CROSS/BLUE SHIELD | Source: Ambulatory Visit | Attending: Family Medicine | Admitting: Family Medicine

## 2017-10-04 DIAGNOSIS — D352 Benign neoplasm of pituitary gland: Secondary | ICD-10-CM | POA: Insufficient documentation

## 2017-10-04 DIAGNOSIS — G4489 Other headache syndrome: Secondary | ICD-10-CM | POA: Insufficient documentation

## 2017-10-04 MED ORDER — GADOBENATE DIMEGLUMINE 529 MG/ML IV SOLN
10.0000 mL | Freq: Once | INTRAVENOUS | Status: AC | PRN
  Administered 2017-10-04: 10 mL via INTRAVENOUS

## 2017-10-06 ENCOUNTER — Other Ambulatory Visit: Payer: Self-pay | Admitting: Family Medicine

## 2017-10-06 MED ORDER — AMOXICILLIN-POT CLAVULANATE 875-125 MG PO TABS: 1.0000 | ORAL_TABLET | Freq: Two times a day (BID) | ORAL | 0 refills | Status: AC

## 2017-10-24 ENCOUNTER — Other Ambulatory Visit: Payer: Self-pay | Admitting: Family Medicine

## 2017-10-25 ENCOUNTER — Other Ambulatory Visit: Payer: Self-pay

## 2017-10-25 ENCOUNTER — Encounter: Payer: Self-pay | Admitting: Family Medicine

## 2017-10-25 MED ORDER — FLUCONAZOLE 150 MG PO TABS: ORAL_TABLET | ORAL | 0 refills | Status: DC

## 2017-10-25 NOTE — Telephone Encounter (Signed)
I sent pt a MyChart message asking if I may schedule her for a CPE with PAP sometime in July so I can send in enough of her BCP to last till she is seen/will await her response/thx dmf

## 2017-10-25 NOTE — Telephone Encounter (Signed)
Pt scheduled CPE with PAP for 7.9.19 @ 12pm/sent in Rx/thx dmf

## 2017-11-25 ENCOUNTER — Encounter: Payer: Self-pay | Admitting: Family Medicine

## 2017-11-29 ENCOUNTER — Encounter: Payer: BLUE CROSS/BLUE SHIELD | Admitting: Family Medicine

## 2018-01-05 ENCOUNTER — Ambulatory Visit: Payer: Self-pay | Admitting: Family Medicine

## 2018-01-09 ENCOUNTER — Other Ambulatory Visit (HOSPITAL_COMMUNITY)
Admission: RE | Admit: 2018-01-09 | Discharge: 2018-01-09 | Disposition: A | Discharge status: Home / Self Care | Payer: BLUE CROSS/BLUE SHIELD | Source: Ambulatory Visit | Attending: Family Medicine | Admitting: Family Medicine

## 2018-01-09 ENCOUNTER — Ambulatory Visit: Payer: Self-pay | Admitting: Family Medicine

## 2018-01-09 ENCOUNTER — Encounter: Payer: Self-pay | Admitting: Family Medicine

## 2018-01-09 VITALS — BP 136/88 | HR 96 | Ht 67.0 in | Wt 237.0 lb

## 2018-01-09 DIAGNOSIS — N898 Other specified noninflammatory disorders of vagina: Secondary | ICD-10-CM | POA: Insufficient documentation

## 2018-01-09 DIAGNOSIS — D352 Benign neoplasm of pituitary gland: Secondary | ICD-10-CM

## 2018-01-09 DIAGNOSIS — Z01419 Encounter for gynecological examination (general) (routine) without abnormal findings: Secondary | ICD-10-CM | POA: Insufficient documentation

## 2018-01-09 DIAGNOSIS — N921 Excessive and frequent menstruation with irregular cycle: Secondary | ICD-10-CM

## 2018-01-09 DIAGNOSIS — N912 Amenorrhea, unspecified: Secondary | ICD-10-CM

## 2018-01-09 MED ORDER — NORETHIN-ETH ESTRAD TRIPHASIC 0.5/0.75/1-35 MG-MCG PO TABS: ORAL_TABLET | ORAL | 0 refills | Status: DC

## 2018-01-09 NOTE — Patient Instructions (Signed)
Great to see you today! We will call you with your results from today and you can view them online.

## 2018-01-09 NOTE — Assessment & Plan Note (Signed)
Reviewed preventive care protocols, scheduled due services, and updated immunizations Discussed nutrition, exercise, diet, and healthy lifestyle.  Pap smear done today.  Declining blood work today.

## 2018-01-09 NOTE — Assessment & Plan Note (Signed)
Virginal, will check wet prep with pap but not STD testing. The patient indicates understanding of these issues and agrees with the plan.

## 2018-01-09 NOTE — Assessment & Plan Note (Signed)
Well controlled with current OCP.

## 2018-01-09 NOTE — Progress Notes (Signed)
Subjective:   Patient ID: Diane Moss, female    DOB: 1996-03-06, 22 y.o.   MRN: [id number]  Diane Moss is a pleasant 22 y.o. year old female who presents to clinic today with Annual Exam (Abnormal vaginal discharge-foul odor-daily-Denies pain Denies fever.)  on 01/09/2018  HPI:  Has never had a pap smear. She has been having some abnormal vaginal discharge with odor. Virginal.  Nortrel is helping with her menorrhagia.  Current Outpatient Medications on File Prior to Visit  Medication Sig Dispense Refill  . SUMAtriptan (IMITREX) 25 MG tablet Take 1 tablet (25 mg total) by mouth every 2 (two) hours as needed for migraine. May repeat in 2 hours if headache persists or recurs. 10 tablet 0   No current facility-administered medications on file prior to visit.     Allergies  Allergen Reactions  . Peach Flavor Swelling and Rash    Past Medical History:  Diagnosis Date  . Acne   . Anxiety   . Headache(784.0)   . Obesity     No past surgical history on file.  Family History  Problem Relation Age of Onset  . Cancer Mother 30       cervial and ovarian  . Diabetes Other   . Hyperlipidemia Other   . Hypertension Other     Social History   Socioeconomic History  . Marital status: Single    Spouse name: Not on file  . Number of children: Not on file  . Years of education: Not on file  . Highest education level: Not on file  Occupational History  . Not on file  Social Needs  . Financial resource strain: Not on file  . Food insecurity:    Worry: Not on file    Inability: Not on file  . Transportation needs:    Medical: Not on file    Non-medical: Not on file  Tobacco Use  . Smoking status: Never Smoker  . Smokeless tobacco: Never Used  Substance and Sexual Activity  . Alcohol use: No    Alcohol/week: 0.0 standard drinks  . Drug use: No  . Sexual activity: Not on file  Lifestyle  . Physical activity:    Days per week: Not on file    Minutes per  session: Not on file  . Stress: Not on file  Relationships  . Social connections:    Talks on phone: Not on file    Gets together: Not on file    Attends religious service: Not on file    Active member of club or organization: Not on file    Attends meetings of clubs or organizations: Not on file    Relationship status: Not on file  . Intimate partner violence:    Fear of current or ex partner: Not on file    Emotionally abused: Not on file    Physically abused: Not on file    Forced sexual activity: Not on file  Other Topics Concern  . Not on file  Social History Narrative   Lives with mom and brother.  Parents are divorced but dad is still involved in her life.  He lives close by.  Never been sexually active.  Wants to be a Pharmacist, hospital and a Geophysicist/field seismologist.   The PMH, PSH, Social History, Family History, Medications, and allergies have been reviewed in [facility], and have been updated if relevant.  Review of Systems  Constitutional: Negative.   HENT: Negative.   Respiratory: Negative.   Cardiovascular:  Negative.   Gastrointestinal: Negative.   Endocrine: Negative.   Genitourinary: Positive for vaginal discharge. Negative for decreased urine volume, dysuria, flank pain, frequency, genital sores, hematuria, menstrual problem, pelvic pain, urgency and vaginal bleeding.  Musculoskeletal: Negative.   Allergic/Immunologic: Negative.   Neurological: Negative.   Psychiatric/Behavioral: Negative.   All other systems reviewed and are negative.      Objective:    BP 136/88 (BP Location: Right Arm, Patient Position: Sitting, Cuff Size: Normal)   Pulse 96   Ht 5\' 7"  (1.702 m)   Wt 237 lb (107.5 kg)   LMP 01/03/2018 (Exact Date)   SpO2 96%   BMI 37.12 kg/m    Physical Exam   General:  Well-developed,well-nourished,in no acute distress; alert,appropriate and cooperative throughout examination Head:  normocephalic and atraumatic.   Eyes:  vision grossly intact, PERRL Ears:  R ear normal  and L ear normal externally, TMs clear bilaterally Nose:  no external deformity.   Mouth:  good dentition.   Neck:  No deformities, masses, or tenderness noted. Breasts:  No mass, nodules, thickening, tenderness, bulging, retraction, inflamation, nipple discharge or skin changes noted.   Lungs:  Normal respiratory effort, chest expands symmetrically. Lungs are clear to auscultation, no crackles or wheezes. Heart:  Normal rate and regular rhythm. S1 and S2 normal without gallop, murmur, click, rub or other extra sounds. Abdomen:  Bowel sounds positive,abdomen soft and non-tender without masses, organomegaly or hernias noted. Rectal:  no external abnormalities.   Genitalia:  Pelvic Exam:        External: normal female genitalia without lesions or masses        Vagina: normal without lesions or masses        Cervix: normal without lesions or masses        Adnexa: normal bimanual exam without masses or fullness        Uterus: normal by palpation        Pap smear: performed Msk:  No deformity or scoliosis noted of thoracic or lumbar spine.   Extremities:  No clubbing, cyanosis, edema, or deformity noted with normal full range of motion of all joints.   Neurologic:  alert & oriented X3 and gait normal.   Skin:  Intact without suspicious lesions or rashes Cervical Nodes:  No lymphadenopathy noted Axillary Nodes:  No palpable lymphadenopathy Psych:  Cognition and judgment appear intact. Alert and cooperative with normal attention span and concentration. No apparent delusions, illusions, hallucinations       Assessment & Plan:   Amenorrhea  Well woman exam with routine gynecological exam  Vaginal discharge  Pituitary microadenoma ([place]) No follow-ups on file.

## 2018-01-10 ENCOUNTER — Encounter: Payer: Self-pay | Admitting: Family Medicine

## 2018-01-10 LAB — CYTOLOGY - PAP
Bacterial vaginitis: NEGATIVE
Candida vaginitis: NEGATIVE
Diagnosis: NEGATIVE

## 2018-01-11 NOTE — Telephone Encounter (Signed)
Dr. Deborra Medina. Pt call back stated that there is some itchiness at times,some odor but no pain. This has been going on for while. Pt is aware of test result. Patient wants to know what can she do to help with this problem?

## 2018-02-03 ENCOUNTER — Encounter: Payer: Self-pay | Admitting: Family Medicine

## 2018-02-06 ENCOUNTER — Encounter: Payer: Self-pay | Admitting: Family Medicine

## 2018-02-07 ENCOUNTER — Encounter: Payer: Self-pay | Admitting: Family Medicine

## 2018-02-24 ENCOUNTER — Telehealth: Payer: Self-pay

## 2018-02-24 NOTE — Telephone Encounter (Signed)
Copied from [place] [phone] [phone]. Topic: General - Other >> Feb 24, 2018  9:08 AM Carolyn Stare wrote:  Pt call and said she only want to speak with Dr Deborra Medina cause she said she told her anytime she need to talk to her just call and she would call her back

## 2018-03-02 MED ORDER — NORETHINDRONE 0.35 MG PO TABS: 1.0000 | ORAL_TABLET | Freq: Every day | ORAL | 11 refills | Status: DC

## 2018-03-02 NOTE — Telephone Encounter (Signed)
Called patient back.  She wants to change her OCPs due to vaginal discharge.  Reviewed chart to see which OCPs she tried.  She has not tried Associate Professor and is willing to try it.  eRx sent.  She will keep me updated.

## 2018-03-02 NOTE — Addendum Note (Signed)
Addended by: Lucille Passy on: 03/02/2018 01:21 PM   Modules accepted: Orders

## 2018-04-13 ENCOUNTER — Other Ambulatory Visit: Payer: Self-pay | Admitting: Family Medicine

## 2018-04-24 ENCOUNTER — Encounter: Payer: Self-pay | Admitting: Family Medicine

## 2018-04-25 ENCOUNTER — Telehealth: Payer: Self-pay

## 2018-04-25 NOTE — Telephone Encounter (Signed)
CN-TA wants pt to come see you this week/she is now scheduled for a 30 minute visit on Friday 12.6.19 @ 9:45am/here is a summary of what has been going on:  8.19.19 Wellness visit pt was taking Nortrel birth control which helped with menorrhagia but was C/O a foul smelling vaginal Kaily Wragg/C  PAP completed and WNL/negative for Garnerella or Candida and pt had declined STD screening at time of visit  10.4.19 BCP was changed to Micronor thinking that this may be the culprit for the foul smelling vaginal Tito Ausmus/C  12.2.19 she sent message of foul smelling vaginal Stone Spirito/C that has progressed stating "I feel like it has made it worse."  This is to help you since you have not seen her before/thx dmf

## 2018-04-25 NOTE — Telephone Encounter (Signed)
Ok, thank you

## 2018-04-28 ENCOUNTER — Ambulatory Visit (INDEPENDENT_AMBULATORY_CARE_PROVIDER_SITE_OTHER): Payer: BLUE CROSS/BLUE SHIELD | Admitting: Nurse Practitioner

## 2018-04-28 ENCOUNTER — Encounter: Payer: Self-pay | Admitting: Nurse Practitioner

## 2018-04-28 VITALS — BP 124/72 | HR 76 | Temp 98.6°F | Ht 67.0 in | Wt 243.0 lb

## 2018-04-28 DIAGNOSIS — R61 Generalized hyperhidrosis: Secondary | ICD-10-CM | POA: Diagnosis not present

## 2018-04-28 DIAGNOSIS — N898 Other specified noninflammatory disorders of vagina: Secondary | ICD-10-CM

## 2018-04-28 NOTE — Progress Notes (Signed)
Subjective:  Patient ID: Diane Moss, female    DOB: 08-01-1995  Age: 22 y.o. MRN: [id number]  CC: Other (Swtiched BC in Novemeber-noticed vaginal odor and discharge/cramping. Has been experiencing these symptoms for atleast one year.  Denies pain. )   Vaginal Discharge  The patient's primary symptoms include a genital odor and vaginal discharge. The patient's pertinent negatives include no genital itching, genital lesions, genital rash, missed menses or vaginal bleeding. This is a chronic problem. The current episode started more than 1 year ago (onset at age of 66). The problem occurs constantly. The problem has been unchanged. The patient is experiencing no pain. She is not pregnant. Pertinent negatives include no abdominal pain, anorexia, back pain, chills, constipation, discolored urine, dysuria, fever, flank pain, frequency, nausea, rash or urgency. The vaginal discharge was thick and white. There has been no bleeding. Nothing aggravates the symptoms. She has tried nothing for the symptoms. She is not sexually active. She uses abstinence for contraception. Menstrual history: amenorrhea due to OCP. There is no history of menorrhagia, ovarian cysts, PID, an STD or vaginosis.  never been sexually active. Does not douch. Wear cotton undergarment only. Menarche age of 36, delayed due to pituitary tumor per patient Use of OCP since age of 34. PAP and wet prep done 42months ago: normal.  Oral contraception was switched from camilla to nortrel 32months ago, no improvement in vaginal discharge or odor. It was then switched back to camila 55months ago, still no change in vaginal discharge or odor  Also Reports execessive sweating and body odor since age 63yrs. It make her self-conscious and will like to know if anything can to done.  Reviewed past Medical, Social and Family history today.  Outpatient Medications Prior to Visit  Medication Sig Dispense Refill  . norethindrone (ORTHO  MICRONOR) 0.35 MG tablet Take 1 tablet (0.35 mg total) by mouth daily. 1 Package 11  . norethindrone-ethinyl estradiol (NORTREL 7/7/7) 0.5/0.75/1-35 MG-MCG tablet TAKE 1 TABLET BY MOUTH EVERY DAY 84 tablet 3  . SUMAtriptan (IMITREX) 25 MG tablet Take 1 tablet (25 mg total) by mouth every 2 (two) hours as needed for migraine. May repeat in 2 hours if headache persists or recurs. (Patient not taking: Reported on 04/28/2018) 10 tablet 0   No facility-administered medications prior to visit.     ROS See HPI  Objective:  BP 124/72   Pulse 76   Temp 98.6 F (37 C) (Oral)   Ht 5\' 7"  (1.702 m)   Wt 243 lb (110.2 kg)   LMP 03/23/2018 (Approximate)   SpO2 99%   BMI 38.06 kg/m   BP Readings from Last 3 Encounters:  04/28/18 124/72  01/09/18 136/88  08/29/17 126/82    Wt Readings from Last 3 Encounters:  04/28/18 243 lb (110.2 kg)  01/09/18 237 lb (107.5 kg)  08/29/17 248 lb 9.6 oz (112.8 kg)    Physical Exam  Constitutional: She is oriented to person, place, and time.  Neurological: She is alert and oriented to person, place, and time.  Psychiatric: She has a normal mood and affect. Her behavior is normal. Thought content normal.  Vitals reviewed.   Lab Results  Component Value Date   WBC 9.1 12/23/2015   HGB 12.8 12/23/2015   HCT 38.4 12/23/2015   PLT 362.0 12/23/2015   GLUCOSE 83 12/23/2015   CHOL 186 12/23/2015   TRIG 100.0 12/23/2015   HDL 71.90 12/23/2015   LDLCALC 94 12/23/2015   ALT 15 12/23/2015  AST 15 12/23/2015   NA 137 12/23/2015   K 4.3 12/23/2015   CL 104 12/23/2015   CREATININE 0.70 12/23/2015   BUN 9 12/23/2015   CO2 25 12/23/2015   TSH 2.24 12/23/2015   HGBA1C 5.4 01/04/2011    Assessment & Plan:   Spent 38mins with ms. Innocent discussing possible causes of vaginal discharge, body odor, and way to manage symptoms. I recommend use of loose fitting  Pants, cotton underwear. And not wearing undergarment at bedtime. Consider [place] of probiotic 1cap BID to  promote good bacterial growth in vaginal region.  Anouk was seen today for other.  Diagnoses and all orders for this visit:  Excessive sweating -     Ambulatory referral to Dermatology  Vaginal discharge   I have discontinued Jesusita N. Sherburn's norethindrone-ethinyl estradiol. I am also having her maintain her SUMAtriptan and norethindrone.  No orders of the defined types were placed in this encounter.   Problem List Items Addressed This Visit      Other   Vaginal discharge    Other Visit Diagnoses    Excessive sweating    -  Primary   Relevant Orders   Ambulatory referral to Dermatology       Follow-up: No follow-ups on file.  Wilfred Lacy, NP

## 2018-04-28 NOTE — Patient Instructions (Addendum)
She decided to maintain current oral contraception  She will like referral to dermatology to discuss ways to manage excessive sweating.  I recommend use of loose fitting  Pants, cotton underwear. And not wearing undergarment at bedtime.  You will be contacted to schedule appt with dermatology.  Consider [place] of probiotic 1cap BID to promote good bacterial growth in vaginal region.   Vaginitis Vaginitis is a condition in which the vaginal tissue swells and becomes red (inflamed). This condition is most often caused by a change in the normal balance of bacteria and yeast that live in the vagina. This change causes an overgrowth of certain bacteria or yeast, which causes the inflammation. There are different types of vaginitis, but the most common types are:  Bacterial vaginosis.  Yeast infection (candidiasis).  Trichomoniasis vaginitis. This is a sexually transmitted disease (STD).  Viral vaginitis.  Atrophic vaginitis.  Allergic vaginitis.  What are the causes? The cause of this condition depends on the type of vaginitis. It can be caused by:  Bacteria (bacterial vaginosis).  Yeast, which is a fungus (yeast infection).  A parasite (trichomoniasis vaginitis).  A virus (viral vaginitis).  Low hormone levels (atrophic vaginitis). Low hormone levels can occur during pregnancy, breastfeeding, or after menopause.  Irritants, such as bubble baths, scented tampons, and feminine sprays (allergic vaginitis).  Other factors can change the normal balance of the yeast and bacteria that live in the vagina. These include:  Antibiotic medicines.  Poor hygiene.  Diaphragms, vaginal sponges, spermicides, birth control pills, and intrauterine devices (IUD).  Sex.  Infection.  Uncontrolled diabetes.  A weakened defense (immune) system.  What increases the risk? This condition is more likely to develop in women who:  Smoke.  Use vaginal douches, scented tampons, or scented  sanitary pads.  Wear tight-fitting pants.  Wear thong underwear.  Use oral birth control pills or an IUD.  Have sex without a condom.  Have multiple sex partners.  Have an STD.  Frequently use the spermicide nonoxynol-9.  Eat lots of foods high in sugar.  Have uncontrolled diabetes.  Have low estrogen levels.  Have a weakened immune system from an immune disorder or medical treatment.  Are pregnant or breastfeeding.  What are the signs or symptoms? Symptoms vary depending on the cause of the vaginitis. Common symptoms include:  Abnormal vaginal discharge. ? The discharge is white, gray, or yellow with bacterial vaginosis. ? The discharge is thick, white, and cheesy with a yeast infection. ? The discharge is frothy and yellow or greenish with trichomoniasis.  A bad vaginal smell. The smell is fishy with bacterial vaginosis.  Vaginal itching, pain, or swelling.  Sex that is painful.  Pain or burning when urinating.  Sometimes there are no symptoms. How is this diagnosed? This condition is diagnosed based on your symptoms and medical history. A physical exam, including a pelvic exam, will also be done. You may also have other tests, including:  Tests to determine the pH level (acidity or alkalinity) of your vagina.  A whiff test, to assess the odor that results when a sample of your vaginal discharge is mixed with a potassium hydroxide solution.  Tests of vaginal fluid. A sample will be examined under a microscope.  How is this treated? Treatment varies depending on the type of vaginitis you have. Your treatment may include:  Antibiotic creams or pills to treat bacterial vaginosis and trichomoniasis.  Antifungal medicines, such as vaginal creams or suppositories, to treat a yeast infection.  Medicine  to ease discomfort if you have viral vaginitis. Your sexual partner should also be treated.  Estrogen delivered in a cream, pill, suppository, or vaginal ring to  treat atrophic vaginitis. If vaginal dryness occurs, lubricants and moisturizing creams may help. You may need to avoid scented soaps, sprays, or douches.  Stopping use of a product that is causing allergic vaginitis. Then using a vaginal cream to treat the symptoms.  Follow these instructions at home: Lifestyle  Keep your genital area clean and dry. Avoid soap, and only rinse the area with water.  Do not douche or use tampons until your health care provider says it is okay to do so. Use sanitary pads, if needed.  Do not have sex until your health care provider approves. When you can return to sex, practice safe sex and use condoms.  Wipe from front to back. This avoids the spread of bacteria from the rectum to the vagina. General instructions  Take over-the-counter and prescription medicines only as told by your health care provider.  If you were prescribed an antibiotic medicine, take or use it as told by your health care provider. Do not stop taking or using the antibiotic even if you start to feel better.  Keep all follow-up visits as told by your health care provider. This is important. How is this prevented?  Use mild, non-scented products. Do not use things that can irritate the vagina, such as fabric softeners. Avoid the following products if they are scented: ? Feminine sprays. ? Detergents. ? Tampons. ? Feminine hygiene products. ? Soaps or bubble baths.  Let air reach your genital area. ? Wear cotton underwear to reduce moisture buildup. ? Avoid wearing underwear while you sleep. ? Avoid wearing tight pants and underwear or nylons without a cotton panel. ? Avoid wearing thong underwear.  Take off any wet clothing, such as bathing suits, as soon as possible.  Practice safe sex and use condoms. Contact a health care provider if:  You have abdominal pain.  You have a fever.  You have symptoms that last for more than 2-3 days. Get help right away if:  You have a  fever and your symptoms suddenly get worse. Summary  Vaginitis is a condition in which the vaginal tissue becomes inflamed.This condition is most often caused by a change in the normal balance of bacteria and yeast that live in the vagina.  Treatment varies depending on the type of vaginitis you have.  Do not douche, use tampons , or have sex until your health care provider approves. When you can return to sex, practice safe sex and use condoms. This information is not intended to replace advice given to you by your health care provider. Make sure you discuss any questions you have with your health care provider. Document Released: 03/07/2007 Document Revised: 06/15/2016 Document Reviewed: 06/15/2016 Elsevier Interactive Patient Education  [employer].

## 2018-05-01 ENCOUNTER — Encounter: Payer: Self-pay | Admitting: Nurse Practitioner

## 2018-05-01 DIAGNOSIS — R61 Generalized hyperhidrosis: Secondary | ICD-10-CM | POA: Insufficient documentation

## 2018-05-08 ENCOUNTER — Encounter: Payer: Self-pay | Admitting: Family Medicine

## 2018-05-09 ENCOUNTER — Other Ambulatory Visit: Payer: Self-pay

## 2018-05-09 MED ORDER — NORETHIN-ETH ESTRAD TRIPHASIC 0.5/0.75/1-35 MG-MCG PO TABS: 1.0000 | ORAL_TABLET | Freq: Every day | ORAL | 1 refills | Status: DC

## 2018-05-09 NOTE — Progress Notes (Signed)
Per TA ok to Jaydan Chretien/C [employer] and restart Nortrel/Rx sent in and message sent to pt/thx dmf

## 2018-07-24 ENCOUNTER — Ambulatory Visit: Payer: BLUE CROSS/BLUE SHIELD | Admitting: Family Medicine

## 2018-07-24 ENCOUNTER — Encounter: Payer: Self-pay | Admitting: Family Medicine

## 2018-07-25 ENCOUNTER — Telehealth: Payer: Self-pay | Admitting: Family Medicine

## 2018-07-25 MED ORDER — HYDROCODONE-ACETAMINOPHEN 10-325 MG PO TABS: 1.0000 | ORAL_TABLET | Freq: Three times a day (TID) | ORAL | 0 refills | Status: DC | PRN

## 2018-07-25 MED ORDER — TAMSULOSIN HCL 0.4 MG PO CAPS: ORAL_CAPSULE | ORAL | 0 refills | Status: DC

## 2018-07-25 NOTE — Telephone Encounter (Signed)
Spoke with patient's mother . Diane Moss her classic kidney stone pain.  Will send flomax and norvco as needed.  Advised patient's mom that if symptoms do no improve today, needs to be seen ASAP.    CLINICAL DATA:  Left flank pain and hematuria.   EXAM:  CT ABDOMEN AND PELVIS WITHOUT CONTRAST   TECHNIQUE:  Multidetector CT imaging of the abdomen and pelvis was performed  following the standard protocol without IV contrast.   COMPARISON:  None.   FINDINGS:  Lung bases show no acute findings. Heart size normal. No pericardial  or pleural effusion.   Liver, gallbladder, adrenal glands and right kidney are  unremarkable. Left kidney is edematous. Moderate to severe left  hydronephrosis secondary to a 7 mm stone in the proximal left ureter  (image 44). Remainder of the left ureter is decompressed. Spleen,  pancreas, stomach and bowel are unremarkable. Uterus and ovaries are  visualized. Bladder is unremarkable. No pathologically enlarged  lymph nodes. No free fluid. No worrisome lytic or sclerotic lesions.   IMPRESSION:  Moderate to severe left hydronephrosis secondary to a 7 mm proximal  left ureteral stone.    Electronically Signed    By: Lorin Picket M.D.    On: 10/22/2013 12:50

## 2018-07-27 ENCOUNTER — Encounter (INDEPENDENT_AMBULATORY_CARE_PROVIDER_SITE_OTHER): Payer: Self-pay | Admitting: Family Medicine

## 2018-07-27 NOTE — Progress Notes (Signed)
Spoke with patient's mom, Diane Moss.  Diane Moss's symptoms progressing rapidly despite flomax and narcotics.  Has known kidney stones with hydronephrosis. Advised patient's mom to take her directly to [facility]. No charge for not coming to appointment today.   CLINICAL DATA:  23 year old female with nausea and chest/epigastric pain  EXAM: CT ABDOMEN AND PELVIS WITH CONTRAST  TECHNIQUE: Multidetector CT imaging of the abdomen and pelvis was performed using the standard protocol following bolus administration of intravenous contrast.  CONTRAST:  31mL OMNIPAQUE IOHEXOL 300 MG/ML  SOLN  COMPARISON:  Abdominal plain film 11/13/2013, 10/25/2013  FINDINGS: Lower chest:  Unremarkable appearance of the soft tissues.  Heart size within normal limits.  No pericardial fluid/thickening.  No lower mediastinal adenopathy.  No confluent airspace disease, visualized pneumothorax, or pleural effusion.  Abdomen/pelvis:  Unremarkable appearance of the liver and spleen. Unremarkable appearance of bilateral adrenal glands.  No pericholecystic or peripancreatic fluid or inflammatory changes.  Differential attenuation of the fluid within the gallbladder lumen, which may represent a biliary sludge.  No intrahepatic or extrahepatic biliary ductal dilatation.  Unremarkable appearance the bilateral kidneys without nephrolithiasis or hydronephrosis. No ureteral stones identified.  No free intraperitoneal air.  No significant free fluid.  No abnormal distension of small bowel or colon. No transition point. Enteric contrast present within small bowel. Normal appendix.  Unremarkable appearance of uterus and adnexa.  Unremarkable appearance of the vasculature with no significant atherosclerotic disease.  No displaced fracture.  IMPRESSION: No acute CT findings to account for the patient's complaints of abdominal pain and nausea.  Signed,  Dulcy Fanny. Earleen Newport, DO  [facility]  [facility]   Electronically Signed   By: Corrie Mckusick O.D.   On: 02/13/2014 20:41

## 2018-07-30 ENCOUNTER — Other Ambulatory Visit: Payer: Self-pay | Admitting: Family Medicine

## 2018-07-30 DIAGNOSIS — N200 Calculus of kidney: Secondary | ICD-10-CM

## 2018-08-14 ENCOUNTER — Ambulatory Visit
Admission: RE | Admit: 2018-08-14 | Discharge: 2018-08-14 | Disposition: A | Discharge status: Home / Self Care | Payer: Self-pay | Source: Ambulatory Visit | Attending: Urology | Admitting: Urology

## 2018-08-14 ENCOUNTER — Ambulatory Visit (INDEPENDENT_AMBULATORY_CARE_PROVIDER_SITE_OTHER): Payer: Self-pay | Admitting: Urology

## 2018-08-14 ENCOUNTER — Telehealth: Payer: Self-pay | Admitting: *Deleted

## 2018-08-14 ENCOUNTER — Other Ambulatory Visit: Payer: Self-pay

## 2018-08-14 ENCOUNTER — Encounter: Payer: Self-pay | Admitting: Urology

## 2018-08-14 ENCOUNTER — Ambulatory Visit
Admission: RE | Admit: 2018-08-14 | Discharge: 2018-08-14 | Disposition: A | Discharge status: Home / Self Care | Payer: Self-pay | Attending: Urology | Admitting: Urology

## 2018-08-14 VITALS — BP 115/78 | HR 93 | Ht 67.0 in | Wt 244.0 lb

## 2018-08-14 DIAGNOSIS — N200 Calculus of kidney: Secondary | ICD-10-CM

## 2018-08-14 LAB — URINALYSIS, COMPLETE
Bilirubin, UA: NEGATIVE
Glucose, UA: NEGATIVE
Ketones, UA: NEGATIVE
LEUKOCYTES UA: NEGATIVE
Nitrite, UA: NEGATIVE
PH UA: 5.5 (ref 5.0–7.5)
Protein, UA: NEGATIVE
Specific Gravity, UA: >1.03 — ABNORMAL HIGH (ref 1.005–1.030)
Urobilinogen, Ur: 0.2 mg/dL (ref 0.2–1.0)

## 2018-08-14 LAB — MICROSCOPIC EXAMINATION
Bacteria, UA: NONE SEEN
WBC, UA: NONE SEEN /hpf (ref 0–5)

## 2018-08-14 NOTE — Patient Instructions (Signed)
Lithotripsy  Lithotripsy is a treatment that can sometimes help eliminate kidney stones and the pain that they cause. A form of lithotripsy, also known as extracorporeal shock wave lithotripsy, is a nonsurgical procedure that crushes a kidney stone with shock waves. These shock waves pass through your body and focus on the kidney stone. They cause the kidney stone to break up while it is still in the urinary tract. This makes it easier for the smaller pieces of stone to pass in the urine. Tell a health care provider about:  Any allergies you have.  All medicines you are taking, including vitamins, herbs, eye drops, creams, and over-the-counter medicines.  Any blood disorders you have.  Any surgeries you have had.  Any medical conditions you have.  Whether you are pregnant or may be pregnant.  Any problems you or family members have had with anesthetic medicines. What are the risks? Generally, this is a safe procedure. However, problems may occur, including:  Infection.  Bleeding of the kidney.  Bruising of the kidney or skin.  Scarring of the kidney, which can lead to: ? Increased blood pressure. ? Poor kidney function. ? Return (recurrence) of kidney stones.  Damage to other structures or organs, such as the liver, colon, spleen, or pancreas.  Blockage (obstruction) of the the tube that carries urine from the kidney to the bladder (ureter).  Failure of the kidney stone to break into pieces (fragments). What happens before the procedure? Staying hydrated Follow instructions from your health care provider about hydration, which may include:  Up to 2 hours before the procedure - you may continue to drink clear liquids, such as water, clear fruit juice, black coffee, and plain tea. Eating and drinking restrictions Follow instructions from your health care provider about eating and drinking, which may include:  8 hours before the procedure - stop eating heavy meals or foods  such as meat, fried foods, or fatty foods.  6 hours before the procedure - stop eating light meals or foods, such as toast or cereal.  6 hours before the procedure - stop drinking milk or drinks that contain milk.  2 hours before the procedure - stop drinking clear liquids. General instructions  Plan to have someone take you home from the hospital or clinic.  Ask your health care provider about: ? Changing or stopping your regular medicines. This is especially important if you are taking diabetes medicines or blood thinners. ? Taking medicines such as aspirin and ibuprofen. These medicines and other NSAIDs can thin your blood. Do not take these medicines for 7 days before your procedure if your health care provider instructs you not to.  You may have tests, such as: ? Blood tests. ? Urine tests. ? Imaging tests, such as a CT scan. What happens during the procedure?  To lower your risk of infection: ? Your health care team will wash or sanitize their hands. ? Your skin will be washed with soap.  An IV tube will be inserted into one of your veins. This tube will give you fluids and medicines.  You will be given one or more of the following: ? A medicine to help you relax (sedative). ? A medicine to make you fall asleep (general anesthetic).  A water-filled cushion may be placed behind your kidney or on your abdomen. In some cases you may be placed in a tub of lukewarm water.  Your body will be positioned in a way that makes it easy to target the kidney   stone.  A flexible tube with holes in it (stent) may be placed in the ureter. This will help keep urine flowing from the kidney if the fragments of the stone have been blocking the ureter.  An X-ray or ultrasound exam will be done to locate your stone.  Shock waves will be aimed at the stone. If you are awake, you may feel a tapping sensation as the shock waves pass through your body. The procedure may vary among health care  providers and hospitals. What happens after the procedure?  You may have an X-ray to see whether the procedure was able to break up the kidney stone and how much of the stone has passed. If large stone fragments remain after treatment, you may need to have a second procedure at a later time.  Your blood pressure, heart rate, breathing rate, and blood oxygen level will be monitored until the medicines you were given have worn off.  You may be given antibiotics or pain medicine as needed.  If a stent was placed in your ureter during surgery, it may stay in place for a few weeks.  You may need strain your urine to collect pieces of the kidney stone for testing.  You will need to drink plenty of water.  Do not drive for 24 hours if you were given a sedative. Summary  Lithotripsy is a treatment that can sometimes help eliminate kidney stones and the pain that they cause.  A form of lithotripsy, also known as extracorporeal shock wave lithotripsy, is a nonsurgical procedure that crushes a kidney stone with shock waves.  Generally, this is a safe procedure. However, problems may occur, including damage to the kidney or other organs, infection, or obstruction of the tube that carries urine from the kidney to the bladder (ureter).  When you go home, you will need to drink plenty of water. You may be asked to strain your urine to collect pieces of the kidney stone for testing. This information is not intended to replace advice given to you by your health care provider. Make sure you discuss any questions you have with your health care provider. Document Released: 05/07/2000 Document Revised: 05/26/2017 Document Reviewed: 03/31/2016 Elsevier Interactive Patient Education  2019 [employer].    Ureteroscopy Ureteroscopy is a procedure to check for and treat problems inside part of the urinary tract. In this procedure, a thin, tube-shaped instrument with a light at the end (ureteroscope) is used  to look at the inside of the kidneys and the ureters, which are the tubes that carry urine from the kidneys to the bladder. The ureteroscope is inserted into one or both of the ureters. You may need this procedure if you have frequent urinary tract infections (UTIs), blood in your urine, or a stone in one of your ureters. A ureteroscopy can be done to find the cause of urine blockage in a ureter and to evaluate other abnormalities inside the ureters or kidneys. If stones are found, they can be removed during the procedure. Polyps, abnormal tissue, and some types of tumors can also be removed or treated. The ureteroscope may also have a tool to remove tissue to be checked for disease under a microscope (biopsy). Tell a health care provider about:  Any allergies you have.  All medicines you are taking, including vitamins, herbs, eye drops, creams, and over-the-counter medicines.  Any problems you or family members have had with anesthetic medicines.  Any blood disorders you have.  Any surgeries you have  had.  Any medical conditions you have.  Whether you are pregnant or may be pregnant. What are the risks? Generally, this is a safe procedure. However, problems may occur, including:  Bleeding.  Infection.  Allergic reactions to medicines.  Scarring that narrows the ureter (stricture).  Creating a hole in the ureter (perforation). What happens before the procedure? Staying hydrated Follow instructions from your health care provider about hydration, which may include:  Up to 2 hours before the procedure - you may continue to drink clear liquids, such as water, clear fruit juice, black coffee, and plain tea. Eating and drinking restrictions Follow instructions from your health care provider about eating and drinking, which may include:  8 hours before the procedure - stop eating heavy meals or foods such as meat, fried foods, or fatty foods.  6 hours before the procedure - stop  eating light meals or foods, such as toast or cereal.  6 hours before the procedure - stop drinking milk or drinks that contain milk.  2 hours before the procedure - stop drinking clear liquids. Medicines  Ask your health care provider about: ? Changing or stopping your regular medicines. This is especially important if you are taking diabetes medicines or blood thinners. ? Taking medicines such as aspirin and ibuprofen. These medicines can thin your blood. Do not take these medicines before your procedure if your health care provider instructs you not to.  You may be given antibiotic medicine to help prevent infection. General instructions  You may have a urine sample taken to check for infection.  Plan to have someone take you home from the hospital or clinic. What happens during the procedure?   To reduce your risk of infection: ? Your health care team will wash or sanitize their hands. ? Your skin will be washed with soap.  An IV tube will be inserted into one of your veins.  You will be given one of the following: ? A medicine to help you relax (sedative). ? A medicine to make you fall asleep (general anesthetic). ? A medicine that is injected into your spine to numb the area below and slightly above the injection site (spinal anesthetic).  To lower your risk of infection, you may be given an antibiotic medicine by an injection or through the IV tube.  The opening from which you urinate (urethra) will be cleaned with a germ-killing solution.  The ureteroscope will be passed through your urethra into your bladder.  A salt-water solution will flow through the ureteroscope to fill your bladder. This will help the health care provider see the openings of your ureters more clearly.  Then, the ureteroscope will be passed into your ureter. ? If a growth is found, a piece of it may be removed so it can be examined under a microscope (biopsy). ? If a stone is found, it may be  removed through the ureteroscope, or the stone may be broken up using a laser, shock waves, or electrical energy. ? In some cases, if the ureter is too small, a tube may be inserted that keeps the ureter open (ureteral stent). The stent may be left in place for 1 or 2 weeks to keep the ureter open, and then the ureteroscopy procedure will be performed.  The scope will be removed, and your bladder will be emptied. The procedure may vary among health care providers and hospitals. What happens after the procedure?  Your blood pressure, heart rate, breathing rate, and blood oxygen level will  be monitored until the medicines you were given have worn off.  You may be asked to urinate.  Donot drive for 24 hours if you were given a sedative. This information is not intended to replace advice given to you by your health care provider. Make sure you discuss any questions you have with your health care provider. Document Released: 05/15/2013 Document Revised: 02/24/2016 Document Reviewed: 02/20/2016 Elsevier Interactive Patient Education  2019 [employer].

## 2018-08-14 NOTE — Progress Notes (Addendum)
08/14/2018 10:33 AM   [name] Synthia Innocent 1995/06/28 [id number]  Referring provider: Lucille Passy, MD [place], [place] [address]  CC: Left flank pain  HPI: I saw Ms. Bartko in urology clinic today in consultation from Dr. Deborra Medina for left-sided flank pain.  She is a 23 year old healthy female with history of nephrolithiasis who was originally seen at [facility] on 07/29/2018 with acute onset of sharp left flank pain that radiated to the left groin.  She also had mild nausea but no vomiting at that time.  Her symptoms were similar to prior episode of nephrolithiasis.  CT results in care everywhere report a 7 mm mid left ureteral stone with moderate left hydronephrosis. She was discharged with medical expulsive therapy, and urine culture was negative at that time.  She reports ongoing left-sided flank pain that radiates to the left groin.  She has not passed a stone that she is seen.  There are no aggravating or alleviating factors.  Severity is moderate.  Urinalysis is pending today.  She denies any fevers, chills, dysuria, urgency, or frequency.   PMH: Past Medical History:  Diagnosis Date  . Acne   . Anxiety   . Headache(784.0)   . Obesity     Surgical History: History reviewed. No pertinent surgical history.   Allergies:  Allergies  Allergen Reactions  . Peach Flavor Swelling and Rash    Family History: Family History  Problem Relation Age of Onset  . Cancer Mother 30       cervial and ovarian  . Diabetes Other   . Hyperlipidemia Other   . Hypertension Other     Social History:  reports that she has never smoked. She has never used smokeless tobacco. She reports that she does not drink alcohol or use drugs.  ROS: Please see flowsheet from today's date for complete review of systems.  Physical Exam: BP 115/78 (BP Location: Left Arm, Patient Position: Sitting, Cuff Size: Large)   Pulse 93   Ht 5\' 7"  (1.702 m)   Wt 244 lb (110.7 kg)   BMI  38.22 kg/m    Constitutional:  Alert and oriented, No acute distress. Cardiovascular: No clubbing, cyanosis, or edema. Respiratory: Normal respiratory effort, no increased work of breathing. GI: Abdomen is soft, nontender, nondistended, no abdominal masses GU: Left CVA tenderness Lymph: No cervical or inguinal lymphadenopathy. Skin: No rashes, bruises or suspicious lesions. Neurologic: Grossly intact, no focal deficits, moving all 4 extremities. Psychiatric: Normal mood and affect.  Laboratory Data: Urine culture 07/29/2018 no growth  Urinalysis today 0 WBCs, 3-10 RBCs, no bacteria, nitrite negative  Pertinent Imaging: I have personally reviewed the KUB today, 10 mm left distal ureteral stone clearly visualized  Patient will bring her CT disc from [facility] ASAP to review: She brought the CD this afternoon and I was able to personally reviewed the images.  There is a 43mm left distal ureteral stone, 720HU, SSD 12cm, with moderate upstream hydronephrosis.  There are no other renal stones.  Assessment & Plan:   In summary, the patient is a healthy 23 year old female with history of nephrolithiasis and persistent left-sided flank pain with CT scan 07/29/2018 with a 7 mm left distal ureteral stone, 720HU, 12cm SSD.  No other renal stones.  KUB today confirms persistent left distal ureteral stone, approximately 7 mm in size.  Urinalysis without infection today.  We discussed various treatment options for urolithiasis including observation with or without medical expulsive therapy, shockwave lithotripsy (SWL),  ureteroscopy and laser lithotripsy with stent placement, and percutaneous nephrolithotomy.  We discussed that management is based on stone size, location, density, patient co-morbidities, and patient preference.   Stones <36mm in size have a >80% spontaneous passage rate. Data surrounding the use of tamsulosin for medical expulsive therapy is controversial, but meta analyses suggests it is most  efficacious for distal stones between 5-43mm in size. Possible side effects include dizziness/lightheadedness, and retrograde ejaculation.  SWL has a lower stone free rate in a single procedure, but also a lower complication rate compared to ureteroscopy and avoids a stent and associated stent related symptoms. Possible complications include renal hematoma, steinstrasse, and need for additional treatment.  Ureteroscopy with laser lithotripsy and stent placement has a higher stone free rate than SWL in a single procedure, however increased complication rate including possible infection, ureteral injury, bleeding, and stent related morbidity. Common stent related symptoms include dysuria, urgency/frequency, and flank pain.  I discussed at length with her the pros and cons of SWL versus ureteroscopy with laser lithotripsy.  We specifically discussed the higher stone free rate with ureteroscopy, but the lower risk of complications with shockwave lithotripsy.  We also discussed stent related symptoms, and need for stent placement after ureteroscopy.  She has undergone shockwave lithotripsy in the past successfully, and would like to pursue this again.  She understands the higher retreatment rate versus ureteroscopy.  Schedule LEFT SWL this Thursday 3/26 for LEFT 42mm distal ureteral stone, 720HU, 12cm SSD RTC 2 weeks with KUB to confirm clearance  Billey Co, MD  [facility] [address] [place], [place] [address] [phone]

## 2018-08-14 NOTE — Telephone Encounter (Signed)
-----   Message from Billey Co, MD sent at 08/14/2018 11:31 AM EDT ----- Left ureteral stone still present on x-ray today. No infection on urinalysis. Please remind her to bring in her CT disc from [facility] ASAP and I'll call her with those CT results to discuss SWL v ureteroscopy this week.  Thanks Nickolas Madrid, MD 08/14/2018

## 2018-08-14 NOTE — H&P (View-Only) (Signed)
08/14/2018 10:33 AM   [name] Synthia Innocent [birth year] [id number]  Referring provider: Lucille Passy, MD [place], [place] [address]  CC: Left flank pain  HPI: I saw Ms. Staib in urology clinic today in consultation from Dr. Deborra Medina for left-sided flank pain.  She is a 23 year old healthy female with history of nephrolithiasis who was originally seen at [facility] on 07/29/2018 with acute onset of sharp left flank pain that radiated to the left groin.  She also had mild nausea but no vomiting at that time.  Her symptoms were similar to prior episode of nephrolithiasis.  CT results in care everywhere report a 7 mm mid left ureteral stone with moderate left hydronephrosis. She was discharged with medical expulsive therapy, and urine culture was negative at that time.  She reports ongoing left-sided flank pain that radiates to the left groin.  She has not passed a stone that she is seen.  There are no aggravating or alleviating factors.  Severity is moderate.  Urinalysis is pending today.  She denies any fevers, chills, dysuria, urgency, or frequency.   PMH: Past Medical History:  Diagnosis Date  . Acne   . Anxiety   . Headache(784.0)   . Obesity     Surgical History: History reviewed. No pertinent surgical history.   Allergies:  Allergies  Allergen Reactions  . Peach Flavor Swelling and Rash    Family History: Family History  Problem Relation Age of Onset  . Cancer Mother 30       cervial and ovarian  . Diabetes Other   . Hyperlipidemia Other   . Hypertension Other     Social History:  reports that she has never smoked. She has never used smokeless tobacco. She reports that she does not drink alcohol or use drugs.  ROS: Please see flowsheet from today's date for complete review of systems.  Physical Exam: BP 115/78 (BP Location: Left Arm, Patient Position: Sitting, Cuff Size: Large)   Pulse 93   Ht 5\' 7"  (1.702 m)   Wt 244 lb (110.7 kg)   BMI  38.22 kg/m    Constitutional:  Alert and oriented, No acute distress. Cardiovascular: No clubbing, cyanosis, or edema. Respiratory: Normal respiratory effort, no increased work of breathing. GI: Abdomen is soft, nontender, nondistended, no abdominal masses GU: Left CVA tenderness Lymph: No cervical or inguinal lymphadenopathy. Skin: No rashes, bruises or suspicious lesions. Neurologic: Grossly intact, no focal deficits, moving all 4 extremities. Psychiatric: Normal mood and affect.  Laboratory Data: Urine culture 07/29/2018 no growth  Urinalysis today 0 WBCs, 3-10 RBCs, no bacteria, nitrite negative  Pertinent Imaging: I have personally reviewed the KUB today, 10 mm left distal ureteral stone clearly visualized  Patient will bring her CT disc from [facility] ASAP to review: She brought the CD this afternoon and I was able to personally reviewed the images.  There is a 43mm left distal ureteral stone, 720HU, SSD 12cm, with moderate upstream hydronephrosis.  There are no other renal stones.  Assessment & Plan:   In summary, the patient is a healthy 23 year old female with history of nephrolithiasis and persistent left-sided flank pain with CT scan 07/29/2018 with a 7 mm left distal ureteral stone, 720HU, 12cm SSD.  No other renal stones.  KUB today confirms persistent left distal ureteral stone, approximately 7 mm in size.  Urinalysis without infection today.  We discussed various treatment options for urolithiasis including observation with or without medical expulsive therapy, shockwave lithotripsy (SWL),  ureteroscopy and laser lithotripsy with stent placement, and percutaneous nephrolithotomy.  We discussed that management is based on stone size, location, density, patient co-morbidities, and patient preference.   Stones <58mm in size have a >80% spontaneous passage rate. Data surrounding the use of tamsulosin for medical expulsive therapy is controversial, but meta analyses suggests it is most  efficacious for distal stones between 5-68mm in size. Possible side effects include dizziness/lightheadedness, and retrograde ejaculation.  SWL has a lower stone free rate in a single procedure, but also a lower complication rate compared to ureteroscopy and avoids a stent and associated stent related symptoms. Possible complications include renal hematoma, steinstrasse, and need for additional treatment.  Ureteroscopy with laser lithotripsy and stent placement has a higher stone free rate than SWL in a single procedure, however increased complication rate including possible infection, ureteral injury, bleeding, and stent related morbidity. Common stent related symptoms include dysuria, urgency/frequency, and flank pain.  I discussed at length with her the pros and cons of SWL versus ureteroscopy with laser lithotripsy.  We specifically discussed the higher stone free rate with ureteroscopy, but the lower risk of complications with shockwave lithotripsy.  We also discussed stent related symptoms, and need for stent placement after ureteroscopy.  She has undergone shockwave lithotripsy in the past successfully, and would like to pursue this again.  She understands the higher retreatment rate versus ureteroscopy.  Schedule LEFT SWL this Thursday 3/26 for LEFT 64mm distal ureteral stone, 720HU, 12cm SSD RTC 2 weeks with KUB to confirm clearance  Billey Co, MD  [facility] [address], [place], [place] [address] [phone]

## 2018-08-14 NOTE — Telephone Encounter (Signed)
Informed patient as instructed, verbalized understanding. She will get the disc ASAP.

## 2018-08-15 ENCOUNTER — Encounter: Payer: Self-pay | Admitting: *Deleted

## 2018-08-15 ENCOUNTER — Other Ambulatory Visit: Payer: Self-pay | Admitting: Radiology

## 2018-08-15 DIAGNOSIS — N201 Calculus of ureter: Secondary | ICD-10-CM

## 2018-08-16 ENCOUNTER — Other Ambulatory Visit: Payer: Self-pay | Admitting: Family Medicine

## 2018-08-17 ENCOUNTER — Other Ambulatory Visit: Payer: Self-pay

## 2018-08-17 ENCOUNTER — Ambulatory Visit
Admission: RE | Admit: 2018-08-17 | Discharge: 2018-08-17 | Disposition: A | Discharge status: Home / Self Care | Payer: Self-pay | Attending: Urology | Admitting: Urology

## 2018-08-17 ENCOUNTER — Ambulatory Visit: Payer: Self-pay

## 2018-08-17 ENCOUNTER — Encounter
Admission: RE | Disposition: A | Discharge status: Home / Self Care | Payer: Self-pay | Source: Home / Self Care | Attending: Urology

## 2018-08-17 ENCOUNTER — Encounter: Payer: Self-pay | Admitting: *Deleted

## 2018-08-17 DIAGNOSIS — Z6838 Body mass index (BMI) 38.0-38.9, adult: Secondary | ICD-10-CM | POA: Insufficient documentation

## 2018-08-17 DIAGNOSIS — E669 Obesity, unspecified: Secondary | ICD-10-CM | POA: Insufficient documentation

## 2018-08-17 DIAGNOSIS — N201 Calculus of ureter: Secondary | ICD-10-CM

## 2018-08-17 DIAGNOSIS — N132 Hydronephrosis with renal and ureteral calculous obstruction: Secondary | ICD-10-CM | POA: Insufficient documentation

## 2018-08-17 HISTORY — PX: EXTRACORPOREAL SHOCK WAVE LITHOTRIPSY: SHX1557

## 2018-08-17 LAB — POCT PREGNANCY, URINE: Preg Test, Ur: NEGATIVE

## 2018-08-17 SURGERY — LITHOTRIPSY, ESWL
Anesthesia: Moderate Sedation | Laterality: Left

## 2018-08-17 MED ORDER — DIAZEPAM 5 MG PO TABS
10.0000 mg | ORAL_TABLET | ORAL | Status: AC
  Administered 2018-08-17: 10 mg via ORAL

## 2018-08-17 MED ORDER — DIPHENHYDRAMINE HCL 25 MG PO CAPS
ORAL_CAPSULE | ORAL | Status: AC
  Administered 2018-08-17: 25 mg via ORAL
  Filled 2018-08-17: qty 1

## 2018-08-17 MED ORDER — DIAZEPAM 5 MG PO TABS
ORAL_TABLET | ORAL | Status: AC
  Administered 2018-08-17: 10 mg via ORAL
  Filled 2018-08-17: qty 2

## 2018-08-17 MED ORDER — CIPROFLOXACIN HCL 500 MG PO TABS
ORAL_TABLET | ORAL | Status: AC
  Administered 2018-08-17: 500 mg via ORAL
  Filled 2018-08-17: qty 1

## 2018-08-17 MED ORDER — SODIUM CHLORIDE 0.9 % IV SOLN
INTRAVENOUS | Status: DC
  Administered 2018-08-17: 07:00:00 via INTRAVENOUS

## 2018-08-17 MED ORDER — CIPROFLOXACIN HCL 500 MG PO TABS
500.0000 mg | ORAL_TABLET | ORAL | Status: AC
  Administered 2018-08-17: 500 mg via ORAL

## 2018-08-17 MED ORDER — ONDANSETRON HCL 4 MG/2ML IJ SOLN
INTRAMUSCULAR | Status: AC
  Administered 2018-08-17: 4 mg via INTRAVENOUS
  Filled 2018-08-17: qty 2

## 2018-08-17 MED ORDER — HYDROCODONE-ACETAMINOPHEN 10-325 MG PO TABS: 1.0000 | ORAL_TABLET | Freq: Three times a day (TID) | ORAL | 0 refills | Status: DC | PRN

## 2018-08-17 MED ORDER — DIPHENHYDRAMINE HCL 25 MG PO CAPS
25.0000 mg | ORAL_CAPSULE | ORAL | Status: AC
  Administered 2018-08-17: 25 mg via ORAL

## 2018-08-17 MED ORDER — ONDANSETRON HCL 2 MG/ML IV SOLN: 4.0000 mg | Freq: Once | INTRAVENOUS | Status: DC | PRN

## 2018-08-17 MED ORDER — ONDANSETRON HCL 4 MG/2ML IJ SOLN
4.0000 mg | Freq: Once | INTRAMUSCULAR | Status: AC | PRN
  Administered 2018-08-17: 4 mg via INTRAVENOUS

## 2018-08-17 NOTE — Discharge Instructions (Signed)
See [facility] discharge instructions in chart.  

## 2018-08-17 NOTE — Interval H&P Note (Signed)
History and Physical Interval Note:  08/17/2018 7:57 AM  Diane Moss  has presented today for surgery, with the diagnosis of Left Ureteral stone.  The various methods of treatment have been discussed with the patient and family. After consideration of risks, benefits and other options for treatment, the patient has consented to  Procedure(s): EXTRACORPOREAL SHOCK WAVE LITHOTRIPSY (ESWL) (Left) as a surgical intervention.  The patient's history has been reviewed, patient examined, no change in status, stable for surgery.  I have reviewed the patient's chart and labs.  Questions were answered to the patient's satisfaction.     Hollice Espy

## 2018-08-28 ENCOUNTER — Ambulatory Visit
Admission: RE | Admit: 2018-08-28 | Discharge: 2018-08-28 | Disposition: A | Discharge status: Home / Self Care | Payer: Self-pay | Source: Ambulatory Visit | Attending: Urology | Admitting: Urology

## 2018-08-28 ENCOUNTER — Encounter: Payer: Self-pay | Admitting: Urology

## 2018-08-28 ENCOUNTER — Ambulatory Visit
Admission: RE | Admit: 2018-08-28 | Discharge: 2018-08-28 | Disposition: A | Discharge status: Home / Self Care | Payer: Self-pay | Attending: Urology | Admitting: Urology

## 2018-08-28 ENCOUNTER — Other Ambulatory Visit: Payer: Self-pay

## 2018-08-28 DIAGNOSIS — N201 Calculus of ureter: Secondary | ICD-10-CM | POA: Insufficient documentation

## 2018-08-29 ENCOUNTER — Telehealth (INDEPENDENT_AMBULATORY_CARE_PROVIDER_SITE_OTHER): Payer: Self-pay | Admitting: Urology

## 2018-08-29 DIAGNOSIS — N201 Calculus of ureter: Secondary | ICD-10-CM

## 2018-08-29 NOTE — Progress Notes (Signed)
Virtual Visit via Video Note  I connected with Diane Moss on 08/29/18 at  9:30 AM EDT by a video enabled telemedicine application and verified that I am speaking with the correct person using two identifiers.   I discussed the limitations of evaluation and management by telemedicine and the availability of in person appointments. The patient expressed understanding and agreed to proceed.  History of Present Illness: 23 year old female with left distal ureteral calculus 2-week status post ESWL who returns today via virtual visit in follow-up.  She tolerated lithotripsy well.  Since then, she has been passing some sand-like particles and yesterday, passed a slightly larger piece which she described as hard with small spikes.  She continues to have some mild discomfort with voiding which she describes is a sensation like she is trying to pass the stone when she urinates in the left lower quadrant which resolves with voiding.  The pain is not severe and she denies any overt flank pain.  No dysuria, fevers, chills, nausea or vomiting.  She continues to take Flomax.  She continues to strain her urine.  KUB performed yesterday shows distal migration of the stone down to the level of the UVJ.  Overall size of the stone is also decreased.   Observations/Objective: Pleasant, interactive, no acute distress.  Assessment and Plan:  1. Left ureteral stone Interval progression of the stone down to the level of the UVJ, passing small fragments.  Overall, she is in no distress with minimal pain.  Her symptoms are consistent with retained left UVJ stone albeit slightly smaller.  We discussed alternative options at this point including continued time for medical expulsive therapy along with review of warning symptoms and indications for more urgent emergent evaluation.  Alternatives including repeat ESWL vs. Ureteroscopy.  Risks and benefits discussed in detail.    She prefers continued observation at  this time.   Plan for repeat KUB in 2 weeks.  - Abdomen 1 view (KUB); Future   Follow Up Instructions: F/u virtual visit in 2 weeks with KUB prior    I discussed the assessment and treatment plan with the patient. The patient was provided an opportunity to ask questions and all were answered. The patient agreed with the plan and demonstrated an understanding of the instructions.   The patient was advised to call back or seek an in-person evaluation if the symptoms worsen or if the condition fails to improve as anticipated.  I provided 12 minutes of non-face-to-face time during this encounter.   Hollice Espy, MD

## 2018-08-31 ENCOUNTER — Encounter: Payer: Self-pay | Admitting: Urology

## 2018-08-31 ENCOUNTER — Ambulatory Visit: Payer: Self-pay | Admitting: Urology

## 2018-09-12 ENCOUNTER — Telehealth: Payer: Self-pay | Admitting: Urology

## 2018-11-08 ENCOUNTER — Other Ambulatory Visit: Payer: Self-pay

## 2018-11-08 ENCOUNTER — Encounter: Payer: Self-pay | Admitting: Family Medicine

## 2018-11-08 MED ORDER — NORTREL 7/7/7 0.5/0.75/1-35 MG-MCG PO TABS: 1.0000 | ORAL_TABLET | Freq: Every day | ORAL | 3 refills | Status: DC

## 2018-12-13 ENCOUNTER — Encounter: Payer: Self-pay | Admitting: Family Medicine

## 2018-12-29 ENCOUNTER — Telehealth: Payer: Self-pay

## 2018-12-29 NOTE — Telephone Encounter (Signed)
Questions for Screening COVID-19  Symptom onset: None  Travel or Contacts: None  During this illness, did/does the patient experience any of the following symptoms? Fever >100.84F []   Yes [x]   No []   Unknown Subjective fever (felt feverish) []   Yes [x]   No []   Unknown Chills []   Yes [x]   No []   Unknown Muscle aches (myalgia) []   Yes [x]   No []   Unknown Runny nose (rhinorrhea) []   Yes [x]   No []   Unknown Sore throat []   Yes [x]   No []   Unknown Cough (new onset or worsening of chronic cough) []   Yes [x]   No []   Unknown Shortness of breath (dyspnea) []   Yes [x]   No []   Unknown Nausea or vomiting []   Yes [x]   No []   Unknown Headache []   Yes [x]   No []   Unknown Abdominal pain  []   Yes [x]   No []   Unknown Diarrhea (?3 loose/looser than normal stools/24hr period) []   Yes [x]   No []   Unknown Other, specify:  Patient risk factors: Smoker? []   Current []   Former []   Never If female, currently pregnant? []   Yes []   No  Patient Active Problem List   Diagnosis Date Noted  . Excessive sweating 05/01/2018  . Well woman exam with routine gynecological exam 01/09/2018  . Vaginal discharge 01/09/2018  . Chronic headaches 07/25/2017  . Fatigue 12/23/2015  . Contraception management 04/16/2015  . History of nephrolithiasis 10/10/2014  . Menorrhagia 04/24/2014  . Depression 02/16/2013  . Pituitary microadenoma ([place]) 01/08/2011  . Amenorrhea 01/04/2011  . CHILDHOOD OBESITY 02/20/2009    Plan:  []   High risk for COVID-19 with red flags go to ED (with CP, SOB, weak/lightheaded, or fever > 101.5). Call ahead.  []   High risk for COVID-19 but stable. Inform provider and coordinate time for [facility] visit.   []   No red flags but URI signs or symptoms okay for [facility] visit.

## 2019-01-01 ENCOUNTER — Ambulatory Visit: Payer: Self-pay | Admitting: Family Medicine

## 2019-01-01 ENCOUNTER — Encounter: Payer: Self-pay | Admitting: Family Medicine

## 2019-01-01 ENCOUNTER — Other Ambulatory Visit (INDEPENDENT_AMBULATORY_CARE_PROVIDER_SITE_OTHER): Payer: Self-pay

## 2019-01-01 ENCOUNTER — Other Ambulatory Visit: Payer: Self-pay | Admitting: Family Medicine

## 2019-01-01 DIAGNOSIS — Z111 Encounter for screening for respiratory tuberculosis: Secondary | ICD-10-CM

## 2019-01-01 DIAGNOSIS — Z0000 Encounter for general adult medical examination without abnormal findings: Secondary | ICD-10-CM

## 2019-01-01 NOTE — Progress Notes (Signed)
Diane Moss

## 2019-01-03 LAB — QUANTIFERON-TB GOLD PLUS
Mitogen-NIL: >10 IU/mL
NIL: 0.01 IU/mL
QuantiFERON-TB Gold Plus: NEGATIVE
TB1-NIL: 0 IU/mL
TB2-NIL: 0 IU/mL

## 2019-01-05 ENCOUNTER — Encounter: Payer: Self-pay | Admitting: Family Medicine

## 2019-02-12 ENCOUNTER — Encounter: Payer: Self-pay | Admitting: Family Medicine

## 2019-02-12 ENCOUNTER — Ambulatory Visit (INDEPENDENT_AMBULATORY_CARE_PROVIDER_SITE_OTHER): Payer: BC Managed Care – PPO | Admitting: Family Medicine

## 2019-02-12 ENCOUNTER — Other Ambulatory Visit: Payer: Self-pay

## 2019-02-12 VITALS — BP 120/86 | HR 98 | Temp 99.1°F | Ht 66.5 in | Wt 240.0 lb

## 2019-02-12 DIAGNOSIS — R109 Unspecified abdominal pain: Secondary | ICD-10-CM | POA: Diagnosis not present

## 2019-02-12 DIAGNOSIS — R1010 Upper abdominal pain, unspecified: Secondary | ICD-10-CM

## 2019-02-12 DIAGNOSIS — Z87442 Personal history of urinary calculi: Secondary | ICD-10-CM

## 2019-02-12 DIAGNOSIS — E669 Obesity, unspecified: Secondary | ICD-10-CM | POA: Insufficient documentation

## 2019-02-12 DIAGNOSIS — R10A2 Flank pain, left side: Secondary | ICD-10-CM

## 2019-02-12 LAB — POCT URINALYSIS DIPSTICK
Bilirubin, UA: NEGATIVE
Blood, UA: NEGATIVE
Glucose, UA: NEGATIVE
Ketones, UA: 5
Nitrite, UA: NEGATIVE
Protein, UA: NEGATIVE
Spec Grav, UA: 1.015 (ref 1.010–1.025)
Urobilinogen, UA: 0.2 E.U./dL
pH, UA: 7.5 (ref 5.0–8.0)

## 2019-02-12 NOTE — Progress Notes (Signed)
Subjective:   Patient ID: Diane Moss, female    DOB: [birth year], 23 y.o.   MRN: [id number]  Diane Moss is a pleasant 23 y.o. year old female who presents to clinic today with GI Problem (Pt is C/O umbillical area abd pain while and after eating. This started on 9.17.20. Also has nausea without vomiting. Sx persist. Denies diarrhea.) and Flank Pain (Also states that ever since she passed her last kidney stone she still has left sided flank pain.)  on 02/12/2019  HPI:  Abdominal pain- RUQ- umbilical area while and after she is eating.  Started 5 days ago or maybe longer.  Also has nausea without vomiting with it.  Not sure if certain foods make it worse but food definitely triggers it.  Denies diarrhea. No fever.   Flank pain- left sided, has persisted since she passed her last kidney stone in March- comes and goes almost daily- usually a . 7/10.  H/o lithotripsy with Dr. Hollice Espy in 07/2018.  She didn't need a stent. No CT found in Epic but I could see the lithotripsy report.   Lab Results  Component Value Date   TSH 2.24 12/23/2015    Wt Readings from Last 3 Encounters:  02/12/19 240 lb (108.9 kg)  08/17/18 244 lb (110.7 kg)  08/14/18 244 lb (110.7 kg)  ' Current Outpatient Medications on File Prior to Visit  Medication Sig Dispense Refill  . ibuprofen (ADVIL,MOTRIN) 200 MG tablet Take 200 mg by mouth every 6 (six) hours as needed.    . norethindrone-ethinyl estradiol (NORTREL 7/7/7) 0.5/0.75/1-35 MG-MCG tablet Take 1 tablet by mouth daily. 84 tablet 3  . SUMAtriptan (IMITREX) 25 MG tablet Take 1 tablet (25 mg total) by mouth every 2 (two) hours as needed for migraine. May repeat in 2 hours if headache persists or recurs. 10 tablet 0   No current facility-administered medications on file prior to visit.     Allergies  Allergen Reactions  . Peach Flavor Swelling and Rash    Past Medical History:  Diagnosis Date  . Acne   . Anxiety   . Headache(784.0)   . Obesity      Past Surgical History:  Procedure Laterality Date  . EXTRACORPOREAL SHOCK WAVE LITHOTRIPSY Left 08/17/2018   Procedure: EXTRACORPOREAL SHOCK WAVE LITHOTRIPSY (ESWL);  Surgeon: Hollice Espy, MD;  Location: [facility] ORS;  Service: Urology;  Laterality: Left;    Family History  Problem Relation Age of Onset  . Diabetes Other   . Hyperlipidemia Other   . Hypertension Other   . Cancer Mother 47       cervial and ovarian    Social History   Socioeconomic History  . Marital status: Single    Spouse name: Not on file  . Number of children: Not on file  . Years of education: Not on file  . Highest education level: Not on file  Occupational History  . Not on file  Social Needs  . Financial resource strain: Not on file  . Food insecurity    Worry: Not on file    Inability: Not on file  . Transportation needs    Medical: Not on file    Non-medical: Not on file  Tobacco Use  . Smoking status: Never Smoker  . Smokeless tobacco: Never Used  Substance and Sexual Activity  . Alcohol use: No    Alcohol/week: 0.0 standard drinks  . Drug use: No  . Sexual activity: Not on file  Lifestyle  .  Physical activity    Days per week: Not on file    Minutes per session: Not on file  . Stress: Not on file  Relationships  . Social Herbalist on phone: Not on file    Gets together: Not on file    Attends religious service: Not on file    Active member of club or organization: Not on file    Attends meetings of clubs or organizations: Not on file    Relationship status: Not on file  . Intimate partner violence    Fear of current or ex partner: Not on file    Emotionally abused: Not on file    Physically abused: Not on file    Forced sexual activity: Not on file  Other Topics Concern  . Not on file  Social History Narrative   Lives with mom and brother.  Parents are divorced but dad is still involved in her life.  He lives close by.  Never been sexually active.  Wants to be  a Pharmacist, hospital and a Geophysicist/field seismologist.   The PMH, PSH, Social History, Family History, Medications, and allergies have been reviewed in [facility], and have been updated if relevant.   ehyReview of Systems  Constitutional: Negative for fever.  HENT: Negative.   Respiratory: Negative.   Cardiovascular: Negative.   Gastrointestinal: Positive for abdominal pain and nausea. Negative for anal bleeding, blood in stool, constipation, diarrhea, rectal pain and vomiting.  Endocrine: Negative.   Genitourinary: Positive for flank pain and hematuria. Negative for decreased urine volume, dysuria, urgency, vaginal bleeding, vaginal discharge and vaginal pain.  Musculoskeletal: Positive for back pain.  Skin: Negative.   Allergic/Immunologic: Negative.   Neurological: Negative.   Hematological: Negative.   Psychiatric/Behavioral: Negative.   All other systems reviewed and are negative.      Objective:    BP 120/86 (BP Location: Left Arm, Patient Position: Sitting, Cuff Size: Normal)   Pulse 98   Temp 99.1 F (37.3 C) (Oral)   Ht 5' 6.5" (1.689 m)   Wt 240 lb (108.9 kg)   LMP 01/18/2019   SpO2 97%   BMI 38.16 kg/m    Physical Exam Vitals signs and nursing note reviewed.  Constitutional:      General: She is not in acute distress.    Appearance: She is obese. She is not ill-appearing, toxic-appearing or diaphoretic.  HENT:     Head: Normocephalic.     Nose: Nose normal.     Mouth/Throat:     Mouth: Mucous membranes are moist.  Eyes:     Extraocular Movements: Extraocular movements intact.  Neck:     Musculoskeletal: Normal range of motion.  Cardiovascular:     Rate and Rhythm: Normal rate and regular rhythm.     Pulses: Normal pulses.     Heart sounds: Normal heart sounds.  Pulmonary:     Effort: Pulmonary effort is normal.     Breath sounds: Normal breath sounds.  Abdominal:     General: Abdomen is flat. Bowel sounds are normal. There is no distension.     Palpations: Abdomen is soft.  There is no mass.     Tenderness: There is abdominal tenderness. There is left CVA tenderness. There is no right CVA tenderness, guarding or rebound. Negative signs include Murphy's sign and McBurney's sign.     Hernia: No hernia is present.  Musculoskeletal: Normal range of motion.  Skin:    General: Skin is warm and dry.  Neurological:  General: No focal deficit present.     Mental Status: She is alert and oriented to person, place, and time.  Psychiatric:        Mood and Affect: Mood normal.        Behavior: Behavior normal.        Thought Content: Thought content normal.        Judgment: Judgment normal.          Assessment & Plan:   Abdominal pain in female - Plan: CBC with Differential/Platelet, Comprehensive metabolic panel, H. pylori breath test, Lipase, POCT urinalysis dipstick, CANCELED: H. pylori breath test, CANCELED: Lipase, CANCELED: CBC with Differential/Platelet, CANCELED: Comprehensive metabolic panel, CANCELED: CBC with Differential/Platelet, CANCELED: Comprehensive metabolic panel, CANCELED: H. pylori breath test, CANCELED: Lipase, CANCELED: POCT urinalysis dipstick  Obesity (BMI 35.0-39.9 without comorbidity) - Plan: TSH, CANCELED: TSH, CANCELED: TSH  History of nephrolithiasis - Plan: POCT urinalysis dipstick, CANCELED: POCT urinalysis dipstick  Left flank pain - Plan: POCT urinalysis dipstick, CANCELED: POCT urinalysis dipstick  Pain of upper abdomen - Plan: CT RENAL STONE STUDY, POCT urinalysis dipstick, CANCELED: POCT urinalysis dipstick No follow-ups on file.

## 2019-02-12 NOTE — Assessment & Plan Note (Addendum)
Post prandial- ? Biliary colic.  Will order stat labs.  ? Biliary colic?  ? If gallbladder can be visualized with renal stone CT. Orders Placed This Encounter  Procedures  . CT RENAL STONE STUDY  . CBC with Differential/Platelet  . Comprehensive metabolic panel  . H. pylori breath test  . Lipase  . TSH  . POCT urinalysis dipstick

## 2019-02-12 NOTE — Patient Instructions (Signed)
Great to see you.  I will call you with your results as soon as I get them.   [phone]

## 2019-02-12 NOTE — Assessment & Plan Note (Signed)
>  40  minutes spent in face to face time with patient, >50% spent in counselling or coordination of care discussing her flank pain and abdominal pain (mostly RUQ).  Quite a complicated case as there is no imaging available for me to see if she had any other stones remaining after her lithotripsy.  UA negative.  She has been having intermittent left flank pain 7/10 almost daily since March.  I am concerned that another stone is moving and ?hydronephrosis.  Renal stone CT ordered stat.  See above for comments about RUQ abdominal pain.

## 2019-02-13 ENCOUNTER — Encounter: Payer: Self-pay | Admitting: Family Medicine

## 2019-02-13 LAB — LIPASE: Lipase: 21 U/L (ref 7–60)

## 2019-02-13 LAB — CBC WITH DIFFERENTIAL/PLATELET
Absolute Monocytes: 644 cells/uL (ref 200–950)
Basophils Absolute: 70 cells/uL (ref 0–200)
Basophils Relative: 0.8 %
Eosinophils Absolute: 157 cells/uL (ref 15–500)
Eosinophils Relative: 1.8 %
HCT: 38.8 % (ref 35.0–45.0)
Hemoglobin: 12.9 g/dL (ref 11.7–15.5)
Lymphs Abs: 2819 cells/uL (ref 850–3900)
MCH: 28.7 pg (ref 27.0–33.0)
MCHC: 33.2 g/dL (ref 32.0–36.0)
MCV: 86.4 fL (ref 80.0–100.0)
MPV: 10 fL (ref 7.5–12.5)
Monocytes Relative: 7.4 %
Neutro Abs: 5011 cells/uL (ref 1500–7800)
Neutrophils Relative %: 57.6 %
Platelets: 398 10*3/uL (ref 140–400)
RBC: 4.49 10*6/uL (ref 3.80–5.10)
RDW: 12.6 % (ref 11.0–15.0)
Total Lymphocyte: 32.4 %
WBC: 8.7 10*3/uL (ref 3.8–10.8)

## 2019-02-13 LAB — COMPREHENSIVE METABOLIC PANEL
AG Ratio: 1.5 (calc) (ref 1.0–2.5)
ALT: 14 U/L (ref 6–29)
AST: 13 U/L (ref 10–30)
Albumin: 4 g/dL (ref 3.6–5.1)
Alkaline phosphatase (APISO): 54 U/L (ref 31–125)
BUN: 11 mg/dL (ref 7–25)
CO2: 25 mmol/L (ref 20–32)
Calcium: 9 mg/dL (ref 8.6–10.2)
Chloride: 107 mmol/L (ref 98–110)
Creat: 0.74 mg/dL (ref 0.50–1.10)
Globulin: 2.7 g/dL (calc) (ref 1.9–3.7)
Glucose, Bld: 86 mg/dL (ref 65–99)
Potassium: 4.3 mmol/L (ref 3.5–5.3)
Sodium: 140 mmol/L (ref 135–146)
Total Bilirubin: 0.2 mg/dL (ref 0.2–1.2)
Total Protein: 6.7 g/dL (ref 6.1–8.1)

## 2019-02-13 LAB — H. PYLORI BREATH TEST: H. pylori Breath Test: NOT DETECTED

## 2019-02-13 LAB — TSH: TSH: 2.94 mIU/L

## 2019-02-14 ENCOUNTER — Ambulatory Visit
Admission: RE | Admit: 2019-02-14 | Discharge: 2019-02-14 | Disposition: A | Discharge status: Home / Self Care | Payer: BC Managed Care – PPO | Source: Ambulatory Visit | Attending: Family Medicine | Admitting: Family Medicine

## 2019-02-14 ENCOUNTER — Other Ambulatory Visit: Payer: Self-pay

## 2019-02-14 ENCOUNTER — Other Ambulatory Visit: Payer: Self-pay | Admitting: Family Medicine

## 2019-02-14 DIAGNOSIS — R1010 Upper abdominal pain, unspecified: Secondary | ICD-10-CM | POA: Diagnosis not present

## 2019-02-14 MED ORDER — AMOXICILLIN-POT CLAVULANATE 875-125 MG PO TABS: 1.0000 | ORAL_TABLET | Freq: Two times a day (BID) | ORAL | 0 refills | Status: AC

## 2019-02-16 ENCOUNTER — Encounter: Payer: Self-pay | Admitting: Family Medicine

## 2019-02-19 ENCOUNTER — Encounter: Payer: Self-pay | Admitting: Family Medicine

## 2019-02-20 DIAGNOSIS — I880 Nonspecific mesenteric lymphadenitis: Secondary | ICD-10-CM | POA: Insufficient documentation

## 2019-02-20 NOTE — Progress Notes (Signed)
   Subjective:   Patient ID: Diane Moss, female    DOB: 04/26/1996, 23 y.o.   MRN: [id number]  Diane Moss is a pleasant 23 y.o. year old female who presents to clinic today with No chief complaint on file.  on 02/21/2019  HPI:  Initially saw pt on 9/21 with 5 -7 days of RUQ pain in umbilical area with nausea and vomiting.  No diarrhea no fever.  She has had left flank pain with h/o kidney stones requiring lithotripsy in past.     CT of abdomen and pelvis from 9/23 showed:  IMPRESSION: 1. Scattered mesenteric lymph nodes, subcentimeter. In the appropriate clinical setting, this finding may be indicative of a degree of mesenteric adenitis. Note that there is no frank adenopathy by size criteria evident in the abdomen or pelvis.  2. No appreciable bowel wall thickening or bowel obstruction. Scattered colonic diverticula without diverticulitis. No abscess in the abdomen or pelvis. Appendix appears normal.  3. No renal or ureteral calculi. No hydronephrosis. Urinary bladder wall thickness within normal limits.   Review of Systems     Objective:    There were no vitals taken for this visit.   Physical Exam        Assessment & Plan:   No diagnosis found. No follow-ups on file.

## 2019-02-21 ENCOUNTER — Ambulatory Visit: Payer: BC Managed Care – PPO | Admitting: Family Medicine

## 2019-02-22 ENCOUNTER — Other Ambulatory Visit: Payer: Self-pay

## 2019-02-22 ENCOUNTER — Telehealth (INDEPENDENT_AMBULATORY_CARE_PROVIDER_SITE_OTHER): Payer: BC Managed Care – PPO | Admitting: Family Medicine

## 2019-02-22 DIAGNOSIS — I880 Nonspecific mesenteric lymphadenitis: Secondary | ICD-10-CM

## 2019-02-22 NOTE — Progress Notes (Signed)
Virtual Visit via Video   Due to the COVID-19 pandemic, this visit was completed with telemedicine (audio/video) technology to reduce patient and provider exposure as well as to preserve personal protective equipment.   I connected with Stevphen Meuse by a video enabled telemedicine application and verified that I am speaking with the correct person using two identifiers. Location patient: Home Location provider: [facility], Office Persons participating in the virtual visit: Lindwood Coke, MD   I discussed the limitations of evaluation and management by telemedicine and the availability of in person appointments. The patient expressed understanding and agreed to proceed.  Care Team   Patient Care Team: Lucille Passy, MD as PCP - General  Subjective:   HPI:   Initially saw pt on 9/21 with 5 -7 days of RUQ pain in umbilical area with nausea and vomiting.  No diarrhea no fever.  She has had left flank pain with h/o kidney stones requiring lithotripsy in past.     CT of abdomen and pelvis from 9/23 showed:  IMPRESSION: 1. Scattered mesenteric lymph nodes, subcentimeter. In the appropriate clinical setting, this finding may be indicative of a degree of mesenteric adenitis. Note that there is no frank adenopathy by size criteria evident in the abdomen or pelvis.  2. No appreciable bowel wall thickening or bowel obstruction. Scattered colonic diverticula without diverticulitis. No abscess in the abdomen or pelvis. Appendix appears normal.  3. No renal or ureteral calculi. No hydronephrosis. Urinary bladder wall thickness within normal limits.  On 9/21, H yplori neg, CMET and CBC unremarkable.  She is feeling better- less nauseated.    No vomiting.  Did have sharp stabbing pain one day but since then has been better.   Review of Systems  Constitutional: Negative.   HENT: Negative.   Eyes: Negative.   Respiratory: Negative.   Cardiovascular: Negative.    Gastrointestinal: Positive for abdominal pain and nausea. Negative for blood in stool, constipation, diarrhea, heartburn, melena and vomiting.  Genitourinary: Negative.   Musculoskeletal: Negative.   Skin: Negative.   Neurological: Negative.   Endo/Heme/Allergies: Negative.   Psychiatric/Behavioral: Negative.   All other systems reviewed and are negative.    Patient Active Problem List   Diagnosis Date Noted  . Acute mesenteric adenitis 02/20/2019  . Abdominal pain in female 02/12/2019  . Obesity (BMI 35.0-39.9 without comorbidity) 02/12/2019  . Left flank pain 02/12/2019  . Excessive sweating 05/01/2018  . Vaginal discharge 01/09/2018  . Chronic headaches 07/25/2017  . Fatigue 12/23/2015  . Contraception management 04/16/2015  . History of nephrolithiasis 10/10/2014  . Menorrhagia 04/24/2014  . Depression 02/16/2013  . Pituitary microadenoma ([place]) 01/08/2011  . Amenorrhea 01/04/2011  . CHILDHOOD OBESITY 02/20/2009    Social History   Tobacco Use  . Smoking status: Never Smoker  . Smokeless tobacco: Never Used  Substance Use Topics  . Alcohol use: No    Alcohol/week: 0.0 standard drinks    Current Outpatient Medications:  .  amoxicillin-clavulanate (AUGMENTIN) 875-125 MG tablet, Take 1 tablet by mouth 2 (two) times daily for 10 days., Disp: 20 tablet, Rfl: 0 .  ibuprofen (ADVIL,MOTRIN) 200 MG tablet, Take 200 mg by mouth every 6 (six) hours as needed., Disp: , Rfl:  .  norethindrone-ethinyl estradiol (NORTREL 7/7/7) 0.5/0.75/1-35 MG-MCG tablet, Take 1 tablet by mouth daily., Disp: 84 tablet, Rfl: 3 .  SUMAtriptan (IMITREX) 25 MG tablet, Take 1 tablet (25 mg total) by mouth every 2 (two) hours as needed for  migraine. May repeat in 2 hours if headache persists or recurs., Disp: 10 tablet, Rfl: 0  Allergies  Allergen Reactions  . Peach Flavor Swelling and Rash    Objective:   VITALS: Per patient if applicable, see vitals. GENERAL: Alert, appears well and in no acute  distress. HEENT: Atraumatic, conjunctiva clear, no obvious abnormalities on inspection of external nose and ears. NECK: Normal movements of the head and neck. CARDIOPULMONARY: No increased WOB. Speaking in clear sentences. I:E ratio WNL.  MS: Moves all visible extremities without noticeable abnormality. PSYCH: Pleasant and cooperative, well-groomed. Speech normal rate and rhythm. Affect is appropriate. Insight and judgement are appropriate. Attention is focused, linear, and appropriate.  NEURO: CN grossly intact. Oriented as arrived to appointment on time with no prompting. Moves both UE equally.  SKIN: No obvious lesions, wounds, erythema, or cyanosis noted on face or hands.  No flowsheet data found.  Assessment and Plan:   There are no diagnoses linked to this encounter.  Marland Kitchen COVID-19 Education: The signs and symptoms of COVID-19 were discussed with the patient and how to seek care for testing if needed. The importance of social distancing was discussed today. . Reviewed expectations re: course of current medical issues. . Discussed self-management of symptoms. . Outlined signs and symptoms indicating need for more acute intervention. . Patient verbalized understanding and all questions were answered. Marland Kitchen Health Maintenance issues including appropriate healthy diet, exercise, and smoking avoidance were discussed with patient. . See orders for this visit as documented in the electronic medical record.  Arnette Norris, MD  Records requested if needed. Time spent: 25 minutes, of which >50% was spent in obtaining information about her symptoms, reviewing her previous labs, evaluations, and treatments, counseling her about her condition (please see the discussed topics above), and developing a plan to further investigate it; she had a number of questions which I addressed.

## 2019-02-22 NOTE — Assessment & Plan Note (Signed)
Improving. Discussed with patient.  No new or red flag symptoms. Likely secondary to viral GI illness which can take some time to resolve. Discussed checking stool cx/EBV but we agreed to hold off unless symptoms worsen. The patient indicates understanding of these issues and agrees with the plan.

## 2019-02-26 ENCOUNTER — Encounter: Payer: Self-pay | Admitting: Family Medicine

## 2019-03-27 ENCOUNTER — Encounter: Payer: Self-pay | Admitting: Family Medicine

## 2019-04-02 ENCOUNTER — Encounter: Payer: Self-pay | Admitting: Family Medicine

## 2019-04-05 ENCOUNTER — Encounter: Payer: Self-pay | Admitting: Family Medicine

## 2019-04-05 ENCOUNTER — Ambulatory Visit (INDEPENDENT_AMBULATORY_CARE_PROVIDER_SITE_OTHER): Payer: BC Managed Care – PPO | Admitting: Family Medicine

## 2019-04-05 VITALS — BP 120/84 | Ht 66.0 in | Wt 239.0 lb

## 2019-04-05 DIAGNOSIS — E669 Obesity, unspecified: Secondary | ICD-10-CM

## 2019-04-05 MED ORDER — PHENTERMINE HCL 15 MG PO CAPS: 15.0000 mg | ORAL_CAPSULE | Freq: Every morning | ORAL | 0 refills | Status: DC

## 2019-04-05 NOTE — Progress Notes (Signed)
Virtual Visit via Video   Due to the COVID-19 pandemic, this visit was completed with telemedicine (audio/video) technology to reduce patient and provider exposure as well as to preserve personal protective equipment.   I connected with Stevphen Meuse by a video enabled telemedicine application and verified that I am speaking with the correct person using two identifiers. Location patient: Home Location provider: [facility], Office Persons participating in the virtual visit: Lindwood Coke, MD   I discussed the limitations of evaluation and management by telemedicine and the availability of in person appointments. The patient expressed understanding and agreed to proceed.  Care Team   Patient Care Team: Lucille Passy, MD as PCP - General  Subjective:   HPI:   Patient sent the following message:  "Good morning. I just wanted to give an update on my stomach. It's been about 2 weeks since my last stomach issue, which I think was a mixture of menstrual pains and built up gas, but have been better ever since. I was wondering the status on getting a diet pill. Thank you!"   ROS   Patient Active Problem List   Diagnosis Date Noted  . Acute mesenteric adenitis 02/20/2019  . Abdominal pain in female 02/12/2019  . Obesity (BMI 35.0-39.9 without comorbidity) 02/12/2019  . Left flank pain 02/12/2019  . Excessive sweating 05/01/2018  . Vaginal discharge 01/09/2018  . Chronic headaches 07/25/2017  . Fatigue 12/23/2015  . Contraception management 04/16/2015  . History of nephrolithiasis 10/10/2014  . Menorrhagia 04/24/2014  . Depression 02/16/2013  . Pituitary microadenoma ([place]) 01/08/2011  . Amenorrhea 01/04/2011  . CHILDHOOD OBESITY 02/20/2009    Social History   Tobacco Use  . Smoking status: Never Smoker  . Smokeless tobacco: Never Used  Substance Use Topics  . Alcohol use: No    Alcohol/week: 0.0 standard drinks    Current Outpatient Medications:  .   ibuprofen (ADVIL,MOTRIN) 200 MG tablet, Take 200 mg by mouth every 6 (six) hours as needed., Disp: , Rfl:  .  norethindrone-ethinyl estradiol (NORTREL 7/7/7) 0.5/0.75/1-35 MG-MCG tablet, Take 1 tablet by mouth daily., Disp: 84 tablet, Rfl: 3 .  phentermine 15 MG capsule, Take 1 capsule (15 mg total) by mouth every morning., Disp: 30 capsule, Rfl: 0 .  SUMAtriptan (IMITREX) 25 MG tablet, Take 1 tablet (25 mg total) by mouth every 2 (two) hours as needed for migraine. May repeat in 2 hours if headache persists or recurs., Disp: 10 tablet, Rfl: 0  Allergies  Allergen Reactions  . Peach Flavor Swelling and Rash    Objective:  BP 120/84   Ht 5\' 6"  (1.676 m)   Wt 239 lb (108.4 kg)   BMI 38.58 kg/m  BP Readings from Last 3 Encounters:  04/05/19 120/84  02/12/19 120/86  08/17/18 120/81    VITALS: Per patient if applicable, see vitals. GENERAL: Alert, appears well and in no acute distress. HEENT: Atraumatic, conjunctiva clear, no obvious abnormalities on inspection of external nose and ears. NECK: Normal movements of the head and neck. CARDIOPULMONARY: No increased WOB. Speaking in clear sentences. I:E ratio WNL.  MS: Moves all visible extremities without noticeable abnormality. PSYCH: Pleasant and cooperative, well-groomed. Speech normal rate and rhythm. Affect is appropriate. Insight and judgement are appropriate. Attention is focused, linear, and appropriate.  NEURO: CN grossly intact. Oriented as arrived to appointment on time with no prompting. Moves both UE equally.  SKIN: No obvious lesions, wounds, erythema, or cyanosis noted on face  or hands.  No flowsheet data found.  Assessment and Plan:   Steva was seen today for weight loss.  Diagnoses and all orders for this visit:  Obesity (BMI 35.0-39.9 without comorbidity)  Other orders -     phentermine 15 MG capsule; Take 1 capsule (15 mg total) by mouth every morning.    Marland Kitchen COVID-19 Education: The signs and symptoms of  COVID-19 were discussed with the patient and how to seek care for testing if needed. The importance of social distancing was discussed today. . Reviewed expectations re: course of current medical issues. . Discussed self-management of symptoms. . Outlined signs and symptoms indicating need for more acute intervention. . Patient verbalized understanding and all questions were answered. Marland Kitchen Health Maintenance issues including appropriate healthy diet, exercise, and smoking avoidance were discussed with patient. . See orders for this visit as documented in the electronic medical record.  Arnette Norris, MD  Records requested if needed. Time spent: 15 minutes, of which >50% was spent in obtaining information about her symptoms, reviewing her previous labs, evaluations, and treatments, counseling her about her condition (please see the discussed topics above), and developing a plan to further investigate it; she had a number of questions which I addressed.  e

## 2019-04-05 NOTE — Assessment & Plan Note (Signed)
Discussed weight loss plan.  Pt would also like to discuss medication options- discussed phentermine risk benefits, side effects including HTN, pulmonary HTN, stroke.    She would like to start phentermine and lifestyle changes. eRx sent for phentermine 15 mg daily.  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 Follow up in 1 month.  If BMI < 27 will decrease to half dose x 1 month then stop  The patient indicates understanding of these issues and agrees with the plan.

## 2019-04-06 ENCOUNTER — Encounter: Payer: Self-pay | Admitting: Family Medicine

## 2019-05-09 ENCOUNTER — Other Ambulatory Visit: Payer: Self-pay | Admitting: Family Medicine

## 2019-05-09 MED ORDER — PHENTERMINE HCL 15 MG PO CAPS: 15.0000 mg | ORAL_CAPSULE | Freq: Every morning | ORAL | 0 refills | Status: DC

## 2019-05-09 NOTE — Telephone Encounter (Signed)
Last OV 04/05/19 Last fill 04/05/19  #30/0

## 2019-05-22 DIAGNOSIS — F419 Anxiety disorder, unspecified: Secondary | ICD-10-CM | POA: Insufficient documentation

## 2019-05-22 NOTE — Progress Notes (Signed)
Virtual Visit via Video   Due to the COVID-19 pandemic, this visit was completed with telemedicine (audio/video) technology to reduce patient and provider exposure as well as to preserve personal protective equipment.   I connected with Diane Moss by a video enabled telemedicine application and verified that I am speaking with the correct person using two identifiers. Location patient: Home Location provider: [facility], Office Persons participating in the virtual visit: Lindwood Coke, MD   I discussed the limitations of evaluation and management by telemedicine and the availability of in person appointments. The patient expressed understanding and agreed to proceed.  Care Team   Patient Care Team: Lucille Passy, MD as PCP - General  Subjective:   HPI:   Anxiety and depression-  Years ago was on fluoxetine but she doesn't remember why she stopped. Over the summer, anxiety started to get worse.  Yesterday, she started having panic attacks about thinking about going back to work which is unlike her.  She has become more anxious about her mom dying eventhough she is healthy because her dad died 3 years ago.     GAD 7 : Generalized Anxiety Score 05/23/2019  Nervous, Anxious, on Edge 3  Control/stop worrying 3  Worry too much - different things 1  Trouble relaxing 3  Restless 2  Easily annoyed or irritable 1  Afraid - awful might happen 1  Total GAD 7 Score 14  Anxiety Difficulty Somewhat difficult    Depression screen PHQ 2/9 05/23/2019  Decreased Interest 2  Down, Depressed, Hopeless 2  PHQ - 2 Score 4  Altered sleeping 3  Tired, decreased energy 3  Change in appetite 0  Feeling bad or failure about yourself  2  Trouble concentrating 1  Moving slowly or fidgety/restless 0  Suicidal thoughts 0  PHQ-9 Score 13  Difficult doing work/chores Somewhat difficult       Review of Systems  Constitutional: Negative.  Negative for fever and  malaise/fatigue.  HENT: Negative.  Negative for congestion and hearing loss.   Eyes: Negative for blurred vision, discharge and redness.  Respiratory: Negative for cough and shortness of breath.   Cardiovascular: Negative for chest pain, palpitations and leg swelling.  Gastrointestinal: Negative for abdominal pain and heartburn.  Genitourinary: Negative for dysuria.  Musculoskeletal: Negative for falls.  Skin: Negative.  Negative for rash.  Neurological: Negative for loss of consciousness and headaches.  Endo/Heme/Allergies: Does not bruise/bleed easily.  Psychiatric/Behavioral: Positive for depression. Negative for hallucinations, memory loss, substance abuse and suicidal ideas. The patient is nervous/anxious and has insomnia.   All other systems reviewed and are negative.    Patient Active Problem List   Diagnosis Date Noted  . Anxiety 05/22/2019  . Acute mesenteric adenitis 02/20/2019  . Abdominal pain in female 02/12/2019  . Obesity (BMI 35.0-39.9 without comorbidity) 02/12/2019  . Left flank pain 02/12/2019  . Excessive sweating 05/01/2018  . Vaginal discharge 01/09/2018  . Chronic headaches 07/25/2017  . Fatigue 12/23/2015  . Contraception management 04/16/2015  . History of nephrolithiasis 10/10/2014  . Menorrhagia 04/24/2014  . Depression 02/16/2013  . Pituitary microadenoma ([place]) 01/08/2011  . Amenorrhea 01/04/2011  . CHILDHOOD OBESITY 02/20/2009    Social History   Tobacco Use  . Smoking status: Never Smoker  . Smokeless tobacco: Never Used  Substance Use Topics  . Alcohol use: No    Alcohol/week: 0.0 standard drinks    Current Outpatient Medications:  .  ibuprofen (ADVIL,MOTRIN) 200 MG  tablet, Take 200 mg by mouth every 6 (six) hours as needed., Disp: , Rfl:  .  norethindrone-ethinyl estradiol (NORTREL 7/7/7) 0.5/0.75/1-35 MG-MCG tablet, Take 1 tablet by mouth daily., Disp: 84 tablet, Rfl: 3 .  phentermine 15 MG capsule, Take 1 capsule (15 mg total) by mouth  every morning., Disp: 30 capsule, Rfl: 0 .  SUMAtriptan (IMITREX) 25 MG tablet, Take 1 tablet (25 mg total) by mouth every 2 (two) hours as needed for migraine. May repeat in 2 hours if headache persists or recurs., Disp: 10 tablet, Rfl: 0 .  sertraline (ZOLOFT) 50 MG tablet, Take 1 tablet (50 mg total) by mouth daily., Disp: 90 tablet, Rfl: 3  Allergies  Allergen Reactions  . Peach Flavor Swelling and Rash    Objective:  BP 122/80   Temp (!) 97.3 F (36.3 C) (Oral) Comment: per pt. Comment (Src): per pt.  Ht 5\' 6"  (1.676 m)   BMI 38.58 kg/m   VITALS: Per patient if applicable, see vitals. GENERAL: Alert, appears well and in no acute distress. HEENT: Atraumatic, conjunctiva clear, no obvious abnormalities on inspection of external nose and ears. NECK: Normal movements of the head and neck. CARDIOPULMONARY: No increased WOB. Speaking in clear sentences. I:E ratio WNL.  MS: Moves all visible extremities without noticeable abnormality. PSYCH: Pleasant and cooperative, well-groomed. Speech normal rate and rhythm. Affect is appropriate. Insight and judgement are appropriate. Attention is focused, linear, and appropriate.  NEURO: CN grossly intact. Oriented as arrived to appointment on time with no prompting. Moves both UE equally.  SKIN: No obvious lesions, wounds, erythema, or cyanosis noted on face or hands.  Depression screen PHQ 2/9 05/23/2019  Decreased Interest 2  Down, Depressed, Hopeless 2  PHQ - 2 Score 4  Altered sleeping 3  Tired, decreased energy 3  Change in appetite 0  Feeling bad or failure about yourself  2  Trouble concentrating 1  Moving slowly or fidgety/restless 0  Suicidal thoughts 0  PHQ-9 Score 13  Difficult doing work/chores Somewhat difficult     . COVID-19 Education: The signs and symptoms of COVID-19 were discussed with the patient and how to seek care for testing if needed. The importance of social distancing was discussed today. . Reviewed  expectations re: course of current medical issues. . Discussed self-management of symptoms. . Outlined signs and symptoms indicating need for more acute intervention. . Patient verbalized understanding and all questions were answered. Marland Kitchen Health Maintenance issues including appropriate healthy diet, exercise, and smoking avoidance were discussed with patient. . See orders for this visit as documented in the electronic medical record.  Arnette Norris, MD  Records requested if needed. Time spent: 25 minutes, of which >50% was spent in obtaining information about her symptoms, reviewing her previous labs, evaluations, and treatments, counseling her about her condition (please see the discussed topics above), and developing a plan to further investigate it; she had a number of questions which I addressed.   Lab Results  Component Value Date   WBC 8.7 02/12/2019   HGB 12.9 02/12/2019   HCT 38.8 02/12/2019   PLT 398 02/12/2019   GLUCOSE 86 02/12/2019   CHOL 186 12/23/2015   TRIG 100.0 12/23/2015   HDL 71.90 12/23/2015   LDLCALC 94 12/23/2015   ALT 14 02/12/2019   AST 13 02/12/2019   NA 140 02/12/2019   K 4.3 02/12/2019   CL 107 02/12/2019   CREATININE 0.74 02/12/2019   BUN 11 02/12/2019   CO2 25 02/12/2019  TSH 2.94 02/12/2019   HGBA1C 5.4 01/04/2011    Lab Results  Component Value Date   TSH 2.94 02/12/2019   Lab Results  Component Value Date   WBC 8.7 02/12/2019   HGB 12.9 02/12/2019   HCT 38.8 02/12/2019   MCV 86.4 02/12/2019   PLT 398 02/12/2019   Lab Results  Component Value Date   NA 140 02/12/2019   K 4.3 02/12/2019   CO2 25 02/12/2019   GLUCOSE 86 02/12/2019   BUN 11 02/12/2019   CREATININE 0.74 02/12/2019   BILITOT 0.2 02/12/2019   ALKPHOS 53 12/23/2015   AST 13 02/12/2019   ALT 14 02/12/2019   PROT 6.7 02/12/2019   ALBUMIN 3.9 12/23/2015   CALCIUM 9.0 02/12/2019   ANIONGAP 13 02/13/2014   GFR 110.70 12/23/2015   Lab Results  Component Value Date   CHOL  186 12/23/2015   Lab Results  Component Value Date   HDL 71.90 12/23/2015   Lab Results  Component Value Date   LDLCALC 94 12/23/2015   Lab Results  Component Value Date   TRIG 100.0 12/23/2015   Lab Results  Component Value Date   CHOLHDL 3 12/23/2015   Lab Results  Component Value Date   HGBA1C 5.4 01/04/2011       Assessment & Plan:   Problem List Items Addressed This Visit      Active Problems   Anxiety    Assessment: Current symptoms:  No current suicidal and homicidal ideation.   No flowsheet data found.     Plan: 1. Medications:  Discussed potential risks, expected benefits, possible side effects of the medicine. We also discussed how to take it correctly and dosing instructions.  2. Labs: see orders. 3. Counseling  4. If she has any significant side effects to the medicine, she is to stop it and call for advice. Instructed patient to contact office or on-call physician promptly should condition worsen or any new symptoms appear.        Relevant Medications   sertraline (ZOLOFT) 50 MG tablet      I am having Diane Moss start on sertraline. I am also having her maintain her SUMAtriptan, ibuprofen, Nortrel 7/7/7, and phentermine.  Meds ordered this encounter  Medications  . sertraline (ZOLOFT) 50 MG tablet    Sig: Take 1 tablet (50 mg total) by mouth daily.    Dispense:  90 tablet    Refill:  3     Arnette Norris, MD

## 2019-05-22 NOTE — Assessment & Plan Note (Addendum)
Assessment: Anxiety and depression- worsened by situation but has a history. No current suicidal and homicidal ideation.   GAD 7 : Generalized Anxiety Score 05/23/2019  Nervous, Anxious, on Edge 3  Control/stop worrying 3  Worry too much - different things 1  Trouble relaxing 3  Restless 2  Easily annoyed or irritable 1  Afraid - awful might happen 1  Total GAD 7 Score 14  Anxiety Difficulty Somewhat difficult   Depression screen PHQ 2/9 05/23/2019  Decreased Interest 2  Down, Depressed, Hopeless 2  PHQ - 2 Score 4  Altered sleeping 3  Tired, decreased energy 3  Change in appetite 0  Feeling bad or failure about yourself  2  Trouble concentrating 1  Moving slowly or fidgety/restless 0  Suicidal thoughts 0  PHQ-9 Score 13  Difficult doing work/chores Somewhat difficult       Plan: 1.  She declines Counseling at this time.    2.     Start Zoloft 50 mg. Patient is to take 1/2 tablet daily for 10 days, then advance to 1 full tablet thereafter. We discussed possible side effects of headache, GI upset, drowsiness, and SI/HI. If thoughts of SI/HI develop, we discussed to present to the emergency immediately. Patient verbalized understanding.

## 2019-05-23 ENCOUNTER — Encounter: Payer: Self-pay | Admitting: Family Medicine

## 2019-05-23 ENCOUNTER — Telehealth (INDEPENDENT_AMBULATORY_CARE_PROVIDER_SITE_OTHER): Payer: BC Managed Care – PPO | Admitting: Family Medicine

## 2019-05-23 VITALS — BP 122/80 | Temp 97.3°F | Ht 66.0 in

## 2019-05-23 DIAGNOSIS — F339 Major depressive disorder, recurrent, unspecified: Secondary | ICD-10-CM

## 2019-05-23 DIAGNOSIS — F419 Anxiety disorder, unspecified: Secondary | ICD-10-CM

## 2019-05-23 MED ORDER — SERTRALINE HCL 50 MG PO TABS: 50.0000 mg | ORAL_TABLET | Freq: Every day | ORAL | 3 refills | Status: DC

## 2019-06-09 ENCOUNTER — Other Ambulatory Visit: Payer: Self-pay | Admitting: Family Medicine

## 2019-06-11 NOTE — Telephone Encounter (Signed)
Last OV 05/23/19 Last fill 05/09/19  #30/0

## 2019-06-13 ENCOUNTER — Encounter: Payer: Self-pay | Admitting: Family Medicine

## 2019-06-14 ENCOUNTER — Other Ambulatory Visit: Payer: Self-pay | Admitting: Family Medicine

## 2019-06-14 MED ORDER — SERTRALINE HCL 100 MG PO TABS: 100.0000 mg | ORAL_TABLET | Freq: Every day | ORAL | 3 refills | Status: DC

## 2019-07-16 ENCOUNTER — Telehealth: Payer: Self-pay | Admitting: General Practice

## 2019-07-16 DIAGNOSIS — F419 Anxiety disorder, unspecified: Secondary | ICD-10-CM

## 2019-07-16 DIAGNOSIS — E669 Obesity, unspecified: Secondary | ICD-10-CM

## 2019-07-16 DIAGNOSIS — F339 Major depressive disorder, recurrent, unspecified: Secondary | ICD-10-CM

## 2019-07-16 NOTE — Telephone Encounter (Signed)
Patient is calling requesting a TOC from Dr. Deborra Medina to Dr. Raina Mina. Pls advise. CB is [phone]

## 2019-07-17 NOTE — Telephone Encounter (Signed)
MC-Pt is requesting to schedule a TOC visit with you/plz advise/thx dmf 

## 2019-07-18 NOTE — Telephone Encounter (Signed)
Plz call pt to sched TOC visit with Dr. C/thx dmf 

## 2019-07-18 NOTE — Telephone Encounter (Signed)
Ok with me 

## 2019-07-20 MED ORDER — SERTRALINE HCL 100 MG PO TABS: 100.0000 mg | ORAL_TABLET | Freq: Every day | ORAL | 3 refills | Status: DC

## 2019-07-20 MED ORDER — PHENTERMINE HCL 15 MG PO CAPS: ORAL_CAPSULE | ORAL | 0 refills | Status: DC

## 2019-07-20 NOTE — Telephone Encounter (Signed)
rx refilled.

## 2019-07-20 NOTE — Telephone Encounter (Signed)
I scheduled pt for appt but she said she needs a refill on phentermine 15 MG capsule and Zoloft, She said she is out. Please advise

## 2019-08-02 ENCOUNTER — Telehealth (INDEPENDENT_AMBULATORY_CARE_PROVIDER_SITE_OTHER): Payer: BC Managed Care – PPO | Admitting: Family Medicine

## 2019-08-02 ENCOUNTER — Encounter: Payer: Self-pay | Admitting: Family Medicine

## 2019-08-02 VITALS — Temp 97.6°F | Wt 220.0 lb

## 2019-08-02 DIAGNOSIS — J019 Acute sinusitis, unspecified: Secondary | ICD-10-CM | POA: Diagnosis not present

## 2019-08-02 DIAGNOSIS — F419 Anxiety disorder, unspecified: Secondary | ICD-10-CM

## 2019-08-02 DIAGNOSIS — E669 Obesity, unspecified: Secondary | ICD-10-CM | POA: Diagnosis not present

## 2019-08-02 MED ORDER — PREDNISONE 20 MG PO TABS: 40.0000 mg | ORAL_TABLET | Freq: Every day | ORAL | 0 refills | Status: AC

## 2019-08-02 NOTE — Patient Instructions (Signed)
There are no preventive care reminders to display for this patient.  Depression screen PHQ 2/9 05/23/2019  Decreased Interest 2  Down, Depressed, Hopeless 2  PHQ - 2 Score 4  Altered sleeping 3  Tired, decreased energy 3  Change in appetite 0  Feeling bad or failure about yourself  2  Trouble concentrating 1  Moving slowly or fidgety/restless 0  Suicidal thoughts 0  PHQ-9 Score 13  Difficult doing work/chores Somewhat difficult

## 2019-08-02 NOTE — Progress Notes (Signed)
Virtual Visit via Video Note  I connected with Diane Moss on 08/02/19 at  3:00 PM EST by a video enabled telemedicine application and verified that I am speaking with the correct person using two identifiers. Location patient: home Location provider: work Persons participating in the virtual visit: patient, provider  I discussed the limitations of evaluation and management by telemedicine and the availability of in person appointments. The patient expressed understanding and agreed to proceed.  Chief Complaint  Patient presents with  . Establish Care    Medication refill/TOC     HPI: Diane Moss is a 24 y.o. female who is a former patient of Dr. Deborra Medina who presents today for [facility] appt. She has a h/o anxiety, headache, obesity. Last visit with PCP 04/2019, last labs in 01/2019.  She is on zoloft 100mg  daily and phentermine 15mg  daily. She has been on phentermine since 03/2019. She has lost 20+ lbs. No side effects. She states the zoloft was worked very well.  Both meds recently refilled.   She was seen on 3/1 at walk-in clinic and diagnosed with sinusitis. She was given Rx for augmentin BID x 10 days, tessalon capsules, prednisone 10mg  daily x 5 days. Pt states symptoms have improved but she still feels congested, facial pressure.     Past Medical History:  Diagnosis Date  . Acne   . Anxiety   . Headache(784.0)   . Obesity     Past Surgical History:  Procedure Laterality Date  . EXTRACORPOREAL SHOCK WAVE LITHOTRIPSY Left 08/17/2018   Procedure: EXTRACORPOREAL SHOCK WAVE LITHOTRIPSY (ESWL);  Surgeon: Hollice Espy, MD;  Location: [facility] ORS;  Service: Urology;  Laterality: Left;    Family History  Problem Relation Age of Onset  . Diabetes Other   . Hyperlipidemia Other   . Hypertension Other   . Cancer Mother 26       cervial and ovarian    Social History   Tobacco Use  . Smoking status: Never Smoker  . Smokeless tobacco: Never Used  Substance Use Topics  . Alcohol  use: No    Alcohol/week: 0.0 standard drinks  . Drug use: No     Current Outpatient Medications:  .  ibuprofen (ADVIL,MOTRIN) 200 MG tablet, Take 200 mg by mouth every 6 (six) hours as needed., Disp: , Rfl:  .  norethindrone-ethinyl estradiol (NORTREL 7/7/7) 0.5/0.75/1-35 MG-MCG tablet, Take 1 tablet by mouth daily., Disp: 84 tablet, Rfl: 3 .  phentermine 15 MG capsule, TAKE 1 CAPSULE BY MOUTH EVERY DAY IN THE MORNING, Disp: 30 capsule, Rfl: 0 .  sertraline (ZOLOFT) 100 MG tablet, Take 1 tablet (100 mg total) by mouth daily., Disp: 90 tablet, Rfl: 3 .  SUMAtriptan (IMITREX) 25 MG tablet, Take 1 tablet (25 mg total) by mouth every 2 (two) hours as needed for migraine. May repeat in 2 hours if headache persists or recurs., Disp: 10 tablet, Rfl: 0 .  amoxicillin-clavulanate (AUGMENTIN) 875-125 MG tablet, Take 1 tablet by mouth 2 (two) times daily., Disp: , Rfl:  .  benzonatate (TESSALON) 200 MG capsule, Take 200 mg by mouth 3 (three) times daily as needed., Disp: , Rfl:   Allergies  Allergen Reactions  . Peach Flavor Swelling and Rash    ROS: See pertinent positives and negatives per HPI.   EXAM:  VITALS per patient if applicable: Temp [id number] F (36.4 C) (Temporal)   Wt 220 lb (99.8 kg)   LMP 07/31/2019   BMI 35.51 kg/m    GENERAL:  alert, oriented, appears well and in no acute distress  HEENT: atraumatic, conjunctiva clear, no obvious abnormalities on inspection of external nose and ears  NECK: normal movements of the head and neck  LUNGS: on inspection no signs of respiratory distress, breathing rate appears normal, no obvious gross SOB, gasping or wheezing, no conversational dyspnea  CV: no obvious cyanosis  PSYCH/NEURO: pleasant and cooperative, speech and thought processing grossly intact   ASSESSMENT AND PLAN: 1. Anxiety - well-controlled on zoloft 100mg  daily, cont current med/dose  2. Obesity (BMI 35.0-39.9 without comorbidity) - on phentermine 15mg  daily since  03/2019. Pt has lost 20+lbs. Plan to continue x 2 more months then stop. Pt will f/u in 2 mo to discuss next steps for continued weight loss  3. Acute non-recurrent sinusitis, unspecified location - improving but still w/ some congestion and pressure - complete course of abx Rx: - predniSONE (DELTASONE) 20 MG tablet; Take 2 tablets (40 mg total) by mouth daily with breakfast for 5 days.  Dispense: 10 tablet; Refill: 0 - nasal saline spray 2-3x/day - f/u PRN Discussed plan and reviewed medications with patient, including risks, benefits, and potential side effects. Pt expressed understand. All questions answered.    I discussed the assessment and treatment plan with the patient. The patient was provided an opportunity to ask questions and all were answered. The patient agreed with the plan and demonstrated an understanding of the instructions.   The patient was advised to call back or seek an in-person evaluation if the symptoms worsen or if the condition fails to improve as anticipated.   Letta Median, DO

## 2019-08-05 ENCOUNTER — Encounter: Payer: Self-pay | Admitting: Family Medicine

## 2019-08-06 ENCOUNTER — Other Ambulatory Visit: Payer: Self-pay

## 2019-08-06 MED ORDER — NORTREL 7/7/7 0.5/0.75/1-35 MG-MCG PO TABS: 1.0000 | ORAL_TABLET | Freq: Every day | ORAL | 3 refills | Status: DC

## 2019-08-19 ENCOUNTER — Other Ambulatory Visit: Payer: Self-pay | Admitting: Family Medicine

## 2019-08-19 DIAGNOSIS — E669 Obesity, unspecified: Secondary | ICD-10-CM

## 2019-08-20 NOTE — Telephone Encounter (Signed)
Last OV 08/02/19 Last fill 07/20/19  #30/0

## 2019-09-01 ENCOUNTER — Encounter: Payer: Self-pay | Admitting: Family Medicine

## 2019-09-17 ENCOUNTER — Encounter: Payer: Self-pay | Admitting: Family Medicine

## 2019-09-17 DIAGNOSIS — F339 Major depressive disorder, recurrent, unspecified: Secondary | ICD-10-CM

## 2019-09-17 DIAGNOSIS — F419 Anxiety disorder, unspecified: Secondary | ICD-10-CM

## 2019-09-17 NOTE — Telephone Encounter (Signed)
Please see message and advise.  Thank you. Last VV 08/02/19 Last fill 07/20/19 #90/3

## 2019-09-18 NOTE — Telephone Encounter (Signed)
VM full.  Will try back later today.

## 2019-09-18 NOTE — Telephone Encounter (Signed)
She should still have 3refills at the pharmacy. Please instruct her to call her pharmacy.

## 2019-09-19 NOTE — Telephone Encounter (Signed)
VM full

## 2019-09-21 NOTE — Telephone Encounter (Signed)
I spoke with pt and she informed me that the pharmacy has refilled her medication.

## 2019-09-23 ENCOUNTER — Other Ambulatory Visit: Payer: Self-pay | Admitting: Family Medicine

## 2019-09-23 DIAGNOSIS — E669 Obesity, unspecified: Secondary | ICD-10-CM

## 2019-09-24 NOTE — Telephone Encounter (Signed)
Please see message and advise.  Thank you. ° °

## 2019-09-24 NOTE — Telephone Encounter (Signed)
Last OV 08/02/2019 Last fill 08/21/2019  #30/0

## 2019-10-15 ENCOUNTER — Encounter: Payer: Self-pay | Admitting: Family Medicine

## 2019-10-16 ENCOUNTER — Encounter: Payer: Self-pay | Admitting: Family

## 2019-10-16 ENCOUNTER — Telehealth (INDEPENDENT_AMBULATORY_CARE_PROVIDER_SITE_OTHER): Payer: BC Managed Care – PPO | Admitting: Family

## 2019-10-16 DIAGNOSIS — R030 Elevated blood-pressure reading, without diagnosis of hypertension: Secondary | ICD-10-CM | POA: Diagnosis not present

## 2019-10-16 MED ORDER — CONTRAVE 8-90 MG PO TB12: ORAL_TABLET | ORAL | 0 refills | Status: DC

## 2019-10-16 NOTE — Patient Instructions (Signed)
Health Maintenance Due  Topic Date Due  . COVID-19 Vaccine (1) Never done    Depression screen PHQ 2/9 05/23/2019  Decreased Interest 2  Down, Depressed, Hopeless 2  PHQ - 2 Score 4  Altered sleeping 3  Tired, decreased energy 3  Change in appetite 0  Feeling bad or failure about yourself  2  Trouble concentrating 1  Moving slowly or fidgety/restless 0  Suicidal thoughts 0  PHQ-9 Score 13  Difficult doing work/chores Somewhat difficult

## 2019-10-16 NOTE — Progress Notes (Signed)
Acute Office Visit  Subjective:    Patient ID: Diane Moss, female    DOB: 07-Aug-1995, 24 y.o.   MRN: [id number]  Chief Complaint  Patient presents with  . Hypertension    Pt c/o Bp recent Bp being evelvated.  Pt felt dizzy last Thursday and begin checking her bp during school and noticed they have been elevated.  Pt is currently take Phentermine and think that is causeing her hypertention.        Virtual Visit via Video   I connected with patient on 10/16/19 at  2:40 PM EDT by a video enabled telemedicine application and verified that I am speaking with the correct person using two identifiers.  Location patient: Home Location provider: Claudie Fisherman, Office Persons participating in the virtual visit: Patient, Provider, CMA   I discussed the limitations of evaluation and management by telemedicine and the availability of in person appointments. The patient expressed understanding and agreed to proceed.  Subjective:   HPI:    Patient is in today with concerns of elevated blood pressure. She has been on Phentermine since November and is concerned that it may be the cause. Never had elevated blood pressure previously. She has lost about 20 lbs. She would like to try a different medication for weight loss. Patient reports walking daily.  ROS:   See pertinent positives and negatives per HPI.  Patient Active Problem List   Diagnosis Date Noted  . Anxiety 05/22/2019  . Acute mesenteric adenitis 02/20/2019  . Abdominal pain in female 02/12/2019  . Obesity (BMI 35.0-39.9 without comorbidity) 02/12/2019  . Left flank pain 02/12/2019  . Excessive sweating 05/01/2018  . Vaginal discharge 01/09/2018  . Chronic headaches 07/25/2017  . Fatigue 12/23/2015  . Contraception management 04/16/2015  . History of nephrolithiasis 10/10/2014  . Menorrhagia 04/24/2014  . Depression 02/16/2013  . Pituitary microadenoma ([place]) 01/08/2011  . Amenorrhea 01/04/2011  . CHILDHOOD OBESITY  02/20/2009    Social History   Tobacco Use  . Smoking status: Never Smoker  . Smokeless tobacco: Never Used  Substance Use Topics  . Alcohol use: No    Alcohol/week: 0.0 standard drinks    Current Outpatient Medications:  .  ibuprofen (ADVIL,MOTRIN) 200 MG tablet, Take 200 mg by mouth every 6 (six) hours as needed., Disp: , Rfl:  .  norethindrone-ethinyl estradiol (NORTREL 7/7/7) 0.5/0.75/1-35 MG-MCG tablet, Take 1 tablet by mouth daily., Disp: 84 tablet, Rfl: 3 .  sertraline (ZOLOFT) 100 MG tablet, Take 1 tablet (100 mg total) by mouth daily., Disp: 90 tablet, Rfl: 3 .  SUMAtriptan (IMITREX) 25 MG tablet, Take 1 tablet (25 mg total) by mouth every 2 (two) hours as needed for migraine. May repeat in 2 hours if headache persists or recurs., Disp: 10 tablet, Rfl: 0 .  amoxicillin-clavulanate (AUGMENTIN) 875-125 MG tablet, Take 1 tablet by mouth 2 (two) times daily., Disp: , Rfl:  .  fluticasone (FLONASE) 50 MCG/ACT nasal spray, PLACE 2 SPRAYS INTO BOTH NOSTRILS ONCE DAILY FOR 30 DAYS, Disp: , Rfl:  .  Naltrexone-buPROPion HCl ER (CONTRAVE) 8-90 MG TB12, Start 1 tablet every morning for 7 days, then 1 tablet twice daily for 7 days, then 2 tablets every morning and one every evening, Disp: 120 tablet, Rfl: 0  Allergies  Allergen Reactions  . Peach Flavor Swelling and Rash    Objective:   BP 138/85 (BP Location: Left Arm, Patient Position: Sitting, Cuff Size: Normal)   Pulse 85   Ht 5\' 6"  (1.676  m)   Wt 220 lb (99.8 kg)   LMP 10/02/2019   BMI 35.51 kg/m   Patient is well-developed, well-nourished in no acute distress.  Resting comfortably at work  Head is normocephalic, atraumatic.  No labored breathing.  Speech is clear and coherent with logical content.  Patient is alert and oriented at baseline.    Assessment and Plan:    Helma was seen today for hypertension.  Diagnoses and all orders for this visit:  Morbid obesity ([place])  Elevated blood pressure reading  Other  orders -     Naltrexone-buPROPion HCl ER (CONTRAVE) 8-90 MG TB12; Start 1 tablet every morning for 7 days, then 1 tablet twice daily for 7 days, then 2 tablets every morning and one every evening     Kennyth Arnold, FNP 10/16/2019  Time spent with the patient: 15 minutes, of which >50% was spent in obtaining information about symptoms, reviewing previous labs, evaluations, and treatments, counseling about condition (please see the discussed topics above), and developing a plan to further investigate it; had a number of questions which I addressed.   Takes medication for anxiety/depression       Assessment & Plan:   Problem List Items Addressed This Visit    None    Visit Diagnoses    Morbid obesity ([place])    -  Primary   Relevant Medications   Naltrexone-buPROPion HCl ER (CONTRAVE) 8-90 MG TB12   Elevated blood pressure reading           Meds ordered this encounter  Medications  . Naltrexone-buPROPion HCl ER (CONTRAVE) 8-90 MG TB12    Sig: Start 1 tablet every morning for 7 days, then 1 tablet twice daily for 7 days, then 2 tablets every morning and one every evening    Dispense:  120 tablet    Refill:  0     Kennyth Arnold, FNP

## 2019-10-17 ENCOUNTER — Encounter: Payer: Self-pay | Admitting: Family Medicine

## 2019-10-18 ENCOUNTER — Telehealth: Payer: Self-pay | Admitting: Family Medicine

## 2019-10-18 NOTE — Telephone Encounter (Signed)
Patient called and stated that she was seen yesterday and her BP this morning was 144/94. Patient wanted to know how long does it take for BP to come after stopping taking phentermine . Also patient stated that she is experiencing some tightness in her chest. Transferred call to the nurse triage for further evaluation. CB is [phone]

## 2019-10-24 ENCOUNTER — Telehealth: Payer: Self-pay | Admitting: Family Medicine

## 2019-10-24 NOTE — Telephone Encounter (Signed)
Patient is calling and stated that NP Justin Mend  switched her to Contour weight loss medication but that medication is not covered by insurance. Patient wanted to know if another medication can be sent to pharmacy that is covered by insurance. CB is [phone]

## 2019-10-24 NOTE — Telephone Encounter (Signed)
Plese see other TE.

## 2019-10-24 NOTE — Telephone Encounter (Signed)
I spoke with pt and asked her to check with her insurance to see what alternatives of medications they would cover and to call us back to let us know.

## 2019-10-25 ENCOUNTER — Encounter: Payer: Self-pay | Admitting: Family Medicine

## 2019-10-25 DIAGNOSIS — E669 Obesity, unspecified: Secondary | ICD-10-CM

## 2019-10-31 MED ORDER — LIRAGLUTIDE -WEIGHT MANAGEMENT 18 MG/3ML ~~LOC~~ SOPN: 0.6000 mg | PEN_INJECTOR | Freq: Every day | SUBCUTANEOUS | 1 refills | Status: DC

## 2019-10-31 NOTE — Addendum Note (Signed)
Addended by: Ronnald Nian on: 10/31/2019 04:59 PM   Modules accepted: Orders

## 2019-11-08 MED ORDER — NOVOFINE 32G X 6 MM MISC: 1 refills | Status: DC

## 2019-11-08 NOTE — Addendum Note (Signed)
Addended by: Darral Dash on: 11/08/2019 02:46 PM   Modules accepted: Orders

## 2020-01-12 ENCOUNTER — Encounter: Payer: Self-pay | Admitting: Family Medicine

## 2020-03-19 ENCOUNTER — Telehealth: Payer: Self-pay

## 2020-03-19 DIAGNOSIS — E669 Obesity, unspecified: Secondary | ICD-10-CM

## 2020-03-19 NOTE — Telephone Encounter (Signed)
PA was completed for Saxenda but unfortunately was denied due to not meeting plan requirements:   Paper denial on your desk.  Please review and advise.  Thanks.  Dm/cma

## 2020-03-19 NOTE — Telephone Encounter (Signed)
It looks like this and other meds won't be covered unless pt is seen by weight management (healthy weight and wellness) for at least 6 mo. I'd be happy to place a referral if pt is interested. If not, she can look into using GoodRx to see if meds would be any cheaper

## 2020-03-20 NOTE — Telephone Encounter (Signed)
Referral placed.

## 2020-03-20 NOTE — Telephone Encounter (Signed)
Spoke to patient and she would like to get the referral to weight management.  Please place the order.  Thanks.  Dm/cma

## 2020-03-20 NOTE — Addendum Note (Signed)
Addended by: Ronnald Nian on: 03/20/2020 09:48 AM   Modules accepted: Orders

## 2020-03-26 ENCOUNTER — Encounter (INDEPENDENT_AMBULATORY_CARE_PROVIDER_SITE_OTHER): Payer: Self-pay

## 2020-05-04 ENCOUNTER — Other Ambulatory Visit: Payer: Self-pay | Admitting: Family Medicine

## 2020-05-04 DIAGNOSIS — E669 Obesity, unspecified: Secondary | ICD-10-CM

## 2020-05-05 NOTE — Telephone Encounter (Signed)
Last VV 10/16/19 Last fill 10/31/19 #20ml/1

## 2020-06-22 ENCOUNTER — Encounter (INDEPENDENT_AMBULATORY_CARE_PROVIDER_SITE_OTHER): Payer: Self-pay

## 2020-06-22 ENCOUNTER — Encounter: Payer: Self-pay | Admitting: Family Medicine

## 2020-06-23 ENCOUNTER — Encounter: Payer: Self-pay | Admitting: Family Medicine

## 2020-06-23 NOTE — Telephone Encounter (Signed)
Please see message and advise.  Thank you. ° °

## 2020-07-01 ENCOUNTER — Other Ambulatory Visit: Payer: Self-pay

## 2020-07-01 MED ORDER — INSULIN PEN NEEDLE 32G X 6 MM MISC: 1.0000 "pen " | 1 refills | Status: AC

## 2020-07-14 ENCOUNTER — Ambulatory Visit (INDEPENDENT_AMBULATORY_CARE_PROVIDER_SITE_OTHER): Payer: Self-pay | Admitting: Family Medicine

## 2020-07-28 ENCOUNTER — Ambulatory Visit (INDEPENDENT_AMBULATORY_CARE_PROVIDER_SITE_OTHER): Payer: Self-pay | Admitting: Family Medicine

## 2020-09-22 ENCOUNTER — Other Ambulatory Visit: Payer: Self-pay | Admitting: Family Medicine

## 2020-10-14 ENCOUNTER — Other Ambulatory Visit: Payer: Self-pay | Admitting: Family Medicine

## 2020-10-14 DIAGNOSIS — F339 Major depressive disorder, recurrent, unspecified: Secondary | ICD-10-CM

## 2020-10-14 DIAGNOSIS — F419 Anxiety disorder, unspecified: Secondary | ICD-10-CM

## 2020-10-14 NOTE — Telephone Encounter (Signed)
Last VV 10/16/19 Last fill 07/20/19  #90/3

## 2020-11-01 ENCOUNTER — Other Ambulatory Visit: Payer: Self-pay | Admitting: Family Medicine

## 2021-02-16 ENCOUNTER — Other Ambulatory Visit: Payer: Self-pay

## 2021-02-16 ENCOUNTER — Ambulatory Visit: Payer: BC Managed Care – PPO | Admitting: Dermatology

## 2021-02-16 DIAGNOSIS — L821 Other seborrheic keratosis: Secondary | ICD-10-CM

## 2021-02-16 DIAGNOSIS — L820 Inflamed seborrheic keratosis: Secondary | ICD-10-CM | POA: Diagnosis not present

## 2021-02-16 NOTE — Progress Notes (Signed)
   New Patient Visit  Subjective  Diane Moss is a 25 y.o. female who presents for the following: Skin Problem (Pt c/o bumps on her eyelids growing and getting larger ).  The following portions of the chart were reviewed this encounter and updated as appropriate:   Tobacco  Allergies  Meds  Problems  Med Hx  Surg Hx  Fam Hx     Review of Systems:  No other skin or systemic complaints except as noted in HPI or Assessment and Plan.  Objective  Well appearing patient in no apparent distress; mood and affect are within normal limits.  A focused examination was performed including face. Relevant physical exam findings are noted in the Assessment and Plan.  upper eyelid x2, right zygoma x 1, left face x 4   (7) (7) Erythematous keratotic or waxy stuck-on papule or plaque.   left upper eyelid margin x 2 (2) Erythematous keratotic or waxy stuck-on papule or plaque.    Assessment & Plan  Inflamed seborrheic keratosis upper eyelid x2, right zygoma x 1, left face x 4   (7)  Destruction of lesion - upper eyelid x2, right zygoma x 1, left face x 4   (7) Complexity: simple   Destruction method: cryotherapy   Informed consent: discussed and consent obtained   Timeout:  patient name, date of birth, surgical site, and procedure verified Lesion destroyed using liquid nitrogen: Yes   Region frozen until ice ball extended beyond lesion: Yes   Outcome: patient tolerated procedure well with no complications   Post-procedure details: wound care instructions given    Seborrheic keratosis, inflamed left upper eyelid margin x 2  Destruction of lesion - left eyelid margin x 2 Complexity: simple   Destruction method: cryotherapy   Informed consent: discussed and consent obtained   Timeout:  patient name, date of birth, surgical site, and procedure verified Lesion destroyed using liquid nitrogen: Yes   Region frozen until ice ball extended beyond lesion: Yes   Outcome: patient tolerated  procedure well with no complications   Post-procedure details: wound care instructions given    Seborrheic Keratoses - Stuck-on, waxy, tan-brown papules and/or plaques  - Benign-appearing - Discussed benign etiology and prognosis. - Observe - Call for any changes  Return if symptoms worsen or fail to improve.  IMarye Round, CMA, am acting as scribe for Sarina Ser, MD .  Documentation: I have reviewed the above documentation for accuracy and completeness, and I agree with the above.  Sarina Ser, MD

## 2021-02-16 NOTE — Patient Instructions (Addendum)

## 2021-02-19 ENCOUNTER — Encounter: Payer: Self-pay | Admitting: Dermatology

## 2021-03-16 ENCOUNTER — Telehealth: Payer: Self-pay | Admitting: Nurse Practitioner

## 2021-03-16 NOTE — Telephone Encounter (Signed)
Pt called needing refill on Nortrel. Note in her chart says that she needs an office visit before any more refills. She claims that she can only do video visits. Will need a TOC sched, and also a visit for med refill. Does it need to be IN person. (Dr C's pt... wanting TOC with Baldo Ash)

## 2021-03-19 NOTE — Telephone Encounter (Signed)
Appointment needs to be in person.

## 2021-12-30 ENCOUNTER — Encounter (INDEPENDENT_AMBULATORY_CARE_PROVIDER_SITE_OTHER): Payer: Self-pay

## 2022-02-17 ENCOUNTER — Ambulatory Visit: Payer: BC Managed Care – PPO | Admitting: Urology

## 2022-02-24 ENCOUNTER — Encounter: Payer: Self-pay | Admitting: Urology

## 2022-02-24 ENCOUNTER — Ambulatory Visit: Payer: BC Managed Care – PPO | Admitting: Urology

## 2022-02-24 VITALS — BP 126/87 | HR 77 | Ht 68.0 in | Wt 247.0 lb

## 2022-02-24 DIAGNOSIS — R1030 Lower abdominal pain, unspecified: Secondary | ICD-10-CM | POA: Diagnosis not present

## 2022-02-24 DIAGNOSIS — Z87442 Personal history of urinary calculi: Secondary | ICD-10-CM | POA: Diagnosis not present

## 2022-02-24 DIAGNOSIS — R8281 Pyuria: Secondary | ICD-10-CM | POA: Diagnosis not present

## 2022-02-24 DIAGNOSIS — R3129 Other microscopic hematuria: Secondary | ICD-10-CM | POA: Diagnosis not present

## 2022-02-24 DIAGNOSIS — R300 Dysuria: Secondary | ICD-10-CM

## 2022-02-24 NOTE — Progress Notes (Signed)
02/24/2022 9:08 PM   Diane Moss Diane Moss 06/06/1995 [id number]  Referring provider: Cyndie Chime, MD [address] [place],  [place] [address]  Chief Complaint  Patient presents with   Dysuria   Urologic history: 1.  Recurrent nephrolithiasis SWL 7 mm left proximal ureteral calculus 10/2013 SWL 07/2025 mm left mid ureteral calculus  HPI: Diane Moss is a 26 y.o. female presents for reestablished patient visit.  Presented to [facility]/03/2022 complaining of left lower quadrant abdominal pain and mild dysuria No fever or chills Urinalysis showed 3+ blood/3+ leukocytes on dipstick and microscopy with >182 WBC/87 RBC Urine culture was ordered and she was treated with cefdinir.  Urine culture returned mixed flora A KUB was performed which showed no abnormalities by report Her pain resolved after several days and she has been asymptomatic    PMH: Past Medical History:  Diagnosis Date   Acne    Anxiety    Headache(784.0)    Obesity     Surgical History: Past Surgical History:  Procedure Laterality Date   EXTRACORPOREAL SHOCK WAVE LITHOTRIPSY Left 08/17/2018   Procedure: EXTRACORPOREAL SHOCK WAVE LITHOTRIPSY (ESWL);  Surgeon: Hollice Espy, MD;  Location: [facility] ORS;  Service: Urology;  Laterality: Left;    Home Medications:  Allergies as of 02/24/2022       Reactions   Peach Flavor Swelling, Rash        Medication List        Accurate as of February 24, 2022  9:08 PM. If you have any questions, ask your nurse or doctor.          STOP taking these medications    amoxicillin-clavulanate 875-125 MG tablet Commonly known as: AUGMENTIN Stopped by: Abbie Sons, MD   fluticasone 50 MCG/ACT nasal spray Commonly known as: FLONASE Stopped by: Abbie Sons, MD   SUMAtriptan 25 MG tablet Commonly known as: Imitrex Stopped by: Abbie Sons, MD       TAKE these medications    BD Pen Needle Micro U/F 32G X 6 MM Misc Generic  drug: Insulin Pen Needle USE AS DIRECTED   Contrave 8-90 MG Tb12 Generic drug: Naltrexone-buPROPion HCl ER Start 1 tablet every morning for 7 days, then 1 tablet twice daily for 7 days, then 2 tablets every morning and one every evening   ibuprofen 200 MG tablet Commonly known as: ADVIL Take 200 mg by mouth every 6 (six) hours as needed.   Nortrel 7/7/7 0.5/0.75/1-35 MG-MCG tablet Generic drug: norethindrone-ethinyl estradiol TAKE 1 TABLET BY MOUTH EVERY DAY   Saxenda 18 MG/3ML Sopn Generic drug: Liraglutide -Weight Management INJECT 0.6 MG UNDER THE SKIN ONCE DAILY   sertraline 100 MG tablet Commonly known as: ZOLOFT TAKE 1 TABLET BY MOUTH EVERY DAY        Allergies:  Allergies  Allergen Reactions   Peach Flavor Swelling and Rash    Family History: Family History  Problem Relation Age of Onset   Diabetes Other    Hyperlipidemia Other    Hypertension Other    Cancer Mother 27       cervial and ovarian    Social History:  reports that she has never smoked. She has never used smokeless tobacco. She reports that she does not drink alcohol and does not use drugs.   Physical Exam: BP 126/87   Pulse 77   Ht '5\' 8"'$  (1.727 m)   Wt 247 lb (112 kg)   BMI 37.56 kg/m  Constitutional:  Alert and oriented, No acute distress. HEENT: [place] AT Respiratory: Normal respiratory effort, no increased work of breathing. Psychiatric: Normal mood and affect.  Laboratory Data:  Urinalysis UA was obtained today however patient is currently menstruating.  Microscopy showed 11-30 WBC and >30 RBC   Pertinent Imaging: KUB images were unable to be reviewed   Assessment & Plan:   26 y.o. female with a history of recurrent stone disease and recent episode of abdominal pain with UA showing significant pyuria and microhematuria.  Urine culture was negative and pain has resolved Currently menstruating and UA today unreliable Lab visit 2 weeks for repeat urinalysis If persistent pyuria  or microhematuria recommend stone protocol CT   Abbie Sons, MD  [facility] [address] [place], [place] [address] [phone]

## 2022-02-25 LAB — URINALYSIS, COMPLETE
Bilirubin, UA: NEGATIVE
Glucose, UA: NEGATIVE
Ketones, UA: NEGATIVE
Nitrite, UA: NEGATIVE
Specific Gravity, UA: 1.025 (ref 1.005–1.030)
Urobilinogen, Ur: 0.2 mg/dL (ref 0.2–1.0)
pH, UA: 5 (ref 5.0–7.5)

## 2022-02-25 LAB — MICROSCOPIC EXAMINATION: RBC, Urine: >30 /hpf — AB (ref 0–2)

## 2022-02-26 ENCOUNTER — Encounter: Payer: Self-pay | Admitting: Urology

## 2022-03-10 ENCOUNTER — Other Ambulatory Visit: Payer: BC Managed Care – PPO

## 2022-03-10 DIAGNOSIS — Z87442 Personal history of urinary calculi: Secondary | ICD-10-CM

## 2022-03-10 LAB — URINALYSIS, COMPLETE
Bilirubin, UA: NEGATIVE
Glucose, UA: NEGATIVE
Ketones, UA: NEGATIVE
Leukocytes,UA: NEGATIVE
Nitrite, UA: NEGATIVE
Protein,UA: NEGATIVE
RBC, UA: NEGATIVE
Specific Gravity, UA: 1.03 (ref 1.005–1.030)
Urobilinogen, Ur: 1 mg/dL (ref 0.2–1.0)
pH, UA: 6.5 (ref 5.0–7.5)

## 2022-03-10 LAB — MICROSCOPIC EXAMINATION

## 2022-03-11 ENCOUNTER — Encounter: Payer: Self-pay | Admitting: *Deleted

## 2023-03-01 ENCOUNTER — Other Ambulatory Visit: Payer: Self-pay | Admitting: Internal Medicine

## 2023-03-01 DIAGNOSIS — D352 Benign neoplasm of pituitary gland: Secondary | ICD-10-CM

## 2023-03-02 ENCOUNTER — Ambulatory Visit
Admission: RE | Admit: 2023-03-02 | Discharge: 2023-03-02 | Disposition: A | Discharge status: Home / Self Care | Payer: BC Managed Care – PPO | Source: Ambulatory Visit | Attending: Internal Medicine | Admitting: Internal Medicine

## 2023-03-02 DIAGNOSIS — D352 Benign neoplasm of pituitary gland: Secondary | ICD-10-CM | POA: Insufficient documentation

## 2023-03-02 MED ORDER — GADOBUTROL 1 MMOL/ML IV SOLN
10.0000 mL | Freq: Once | INTRAVENOUS | Status: AC | PRN
  Administered 2023-03-02: 10 mL via INTRAVENOUS

## 2023-09-15 ENCOUNTER — Emergency Department

## 2023-09-15 ENCOUNTER — Other Ambulatory Visit: Payer: Self-pay

## 2023-09-15 DIAGNOSIS — K8020 Calculus of gallbladder without cholecystitis without obstruction: Secondary | ICD-10-CM | POA: Insufficient documentation

## 2023-09-15 DIAGNOSIS — R1013 Epigastric pain: Secondary | ICD-10-CM | POA: Diagnosis present

## 2023-09-15 DIAGNOSIS — R0789 Other chest pain: Secondary | ICD-10-CM | POA: Diagnosis not present

## 2023-09-15 DIAGNOSIS — R7401 Elevation of levels of liver transaminase levels: Secondary | ICD-10-CM | POA: Diagnosis not present

## 2023-09-15 DIAGNOSIS — I4581 Long QT syndrome: Secondary | ICD-10-CM | POA: Diagnosis not present

## 2023-09-15 DIAGNOSIS — R112 Nausea with vomiting, unspecified: Secondary | ICD-10-CM | POA: Diagnosis present

## 2023-09-15 LAB — BASIC METABOLIC PANEL WITH GFR
Anion gap: 11 (ref 5–15)
BUN: 11 mg/dL (ref 6–20)
CO2: 22 mmol/L (ref 22–32)
Calcium: 9.4 mg/dL (ref 8.9–10.3)
Chloride: 104 mmol/L (ref 98–111)
Creatinine, Ser: 0.96 mg/dL (ref 0.44–1.00)
GFR, Estimated: >60 mL/min (ref >60)
Glucose, Bld: 92 mg/dL (ref 70–99)
Potassium: 3.7 mmol/L (ref 3.5–5.1)
Sodium: 137 mmol/L (ref 135–145)

## 2023-09-15 LAB — CBC
HCT: 38.8 % (ref 36.0–46.0)
Hemoglobin: 13 g/dL (ref 12.0–15.0)
MCH: 28.8 pg (ref 26.0–34.0)
MCHC: 33.5 g/dL (ref 30.0–36.0)
MCV: 86 fL (ref 80.0–100.0)
Platelets: 390 10*3/uL (ref 150–400)
RBC: 4.51 MIL/uL (ref 3.87–5.11)
RDW: 12.6 % (ref 11.5–15.5)
WBC: 10.7 10*3/uL — ABNORMAL HIGH (ref 4.0–10.5)
nRBC: 0 % (ref 0.0–0.2)

## 2023-09-15 LAB — TROPONIN I (HIGH SENSITIVITY): Troponin I (High Sensitivity): 3 ng/L (ref <18)

## 2023-09-15 NOTE — ED Triage Notes (Signed)
 Pt to ED via POV c/o central CP that started around 11:30 am today. Pain has gotten worse throughout the day. Describes pain as tight. Endorses SHOB with CP

## 2023-09-16 ENCOUNTER — Other Ambulatory Visit: Payer: Self-pay

## 2023-09-16 ENCOUNTER — Emergency Department

## 2023-09-16 ENCOUNTER — Emergency Department
Admission: EM | Admit: 2023-09-16 | Discharge: 2023-09-17 | Disposition: A | Discharge status: Home / Self Care | Attending: Emergency Medicine | Admitting: Emergency Medicine

## 2023-09-16 ENCOUNTER — Emergency Department
Admission: EM | Admit: 2023-09-16 | Discharge: 2023-09-16 | Disposition: A | Discharge status: Home / Self Care | Attending: Emergency Medicine | Admitting: Emergency Medicine

## 2023-09-16 DIAGNOSIS — R112 Nausea with vomiting, unspecified: Secondary | ICD-10-CM | POA: Insufficient documentation

## 2023-09-16 DIAGNOSIS — R1010 Upper abdominal pain, unspecified: Secondary | ICD-10-CM

## 2023-09-16 DIAGNOSIS — R1013 Epigastric pain: Secondary | ICD-10-CM | POA: Insufficient documentation

## 2023-09-16 DIAGNOSIS — R7401 Elevation of levels of liver transaminase levels: Secondary | ICD-10-CM | POA: Insufficient documentation

## 2023-09-16 DIAGNOSIS — I4581 Long QT syndrome: Secondary | ICD-10-CM | POA: Insufficient documentation

## 2023-09-16 DIAGNOSIS — K8020 Calculus of gallbladder without cholecystitis without obstruction: Secondary | ICD-10-CM | POA: Insufficient documentation

## 2023-09-16 DIAGNOSIS — R0789 Other chest pain: Secondary | ICD-10-CM | POA: Insufficient documentation

## 2023-09-16 DIAGNOSIS — R079 Chest pain, unspecified: Secondary | ICD-10-CM

## 2023-09-16 DIAGNOSIS — R9431 Abnormal electrocardiogram [ECG] [EKG]: Secondary | ICD-10-CM

## 2023-09-16 LAB — COMPREHENSIVE METABOLIC PANEL WITH GFR
ALT: 32 U/L (ref 0–44)
AST: 35 U/L (ref 15–41)
Albumin: 4 g/dL (ref 3.5–5.0)
Alkaline Phosphatase: 57 U/L (ref 38–126)
Anion gap: 11 (ref 5–15)
BUN: 12 mg/dL (ref 6–20)
CO2: 22 mmol/L (ref 22–32)
Calcium: 9.3 mg/dL (ref 8.9–10.3)
Chloride: 103 mmol/L (ref 98–111)
Creatinine, Ser: 1.15 mg/dL — ABNORMAL HIGH (ref 0.44–1.00)
GFR, Estimated: >60 mL/min (ref >60)
Glucose, Bld: 120 mg/dL — ABNORMAL HIGH (ref 70–99)
Potassium: 3.4 mmol/L — ABNORMAL LOW (ref 3.5–5.1)
Sodium: 136 mmol/L (ref 135–145)
Total Bilirubin: 0.9 mg/dL (ref 0.0–1.2)
Total Protein: 7.5 g/dL (ref 6.5–8.1)

## 2023-09-16 LAB — HEPATIC FUNCTION PANEL
ALT: 29 U/L (ref 0–44)
AST: 27 U/L (ref 15–41)
Albumin: 4 g/dL (ref 3.5–5.0)
Alkaline Phosphatase: 56 U/L (ref 38–126)
Bilirubin, Direct: 0.2 mg/dL (ref 0.0–0.2)
Indirect Bilirubin: 0.5 mg/dL (ref 0.3–0.9)
Total Bilirubin: 0.7 mg/dL (ref 0.0–1.2)
Total Protein: 7.5 g/dL (ref 6.5–8.1)

## 2023-09-16 LAB — CBC WITH DIFFERENTIAL/PLATELET
Abs Immature Granulocytes: 0.03 10*3/uL (ref 0.00–0.07)
Basophils Absolute: 0.1 10*3/uL (ref 0.0–0.1)
Basophils Relative: 1 %
Eosinophils Absolute: 0 10*3/uL (ref 0.0–0.5)
Eosinophils Relative: 0 %
HCT: 38 % (ref 36.0–46.0)
Hemoglobin: 12.9 g/dL (ref 12.0–15.0)
Immature Granulocytes: 0 %
Lymphocytes Relative: 23 %
Lymphs Abs: 2.3 10*3/uL (ref 0.7–4.0)
MCH: 29.1 pg (ref 26.0–34.0)
MCHC: 33.9 g/dL (ref 30.0–36.0)
MCV: 85.6 fL (ref 80.0–100.0)
Monocytes Absolute: 0.7 10*3/uL (ref 0.1–1.0)
Monocytes Relative: 7 %
Neutro Abs: 6.7 10*3/uL (ref 1.7–7.7)
Neutrophils Relative %: 69 %
Platelets: 409 10*3/uL — ABNORMAL HIGH (ref 150–400)
RBC: 4.44 MIL/uL (ref 3.87–5.11)
RDW: 12.6 % (ref 11.5–15.5)
WBC: 9.8 10*3/uL (ref 4.0–10.5)
nRBC: 0 % (ref 0.0–0.2)

## 2023-09-16 LAB — TROPONIN I (HIGH SENSITIVITY): Troponin I (High Sensitivity): <2 ng/L (ref <18)

## 2023-09-16 LAB — MAGNESIUM: Magnesium: 2.1 mg/dL (ref 1.7–2.4)

## 2023-09-16 LAB — CBG MONITORING, ED: Glucose-Capillary: 110 mg/dL — ABNORMAL HIGH (ref 70–99)

## 2023-09-16 LAB — HCG, QUANTITATIVE, PREGNANCY: hCG, Beta Chain, Quant, S: 1 m[IU]/mL (ref <5)

## 2023-09-16 LAB — LIPASE, BLOOD: Lipase: 32 U/L (ref 11–51)

## 2023-09-16 MED ORDER — LIDOCAINE VISCOUS HCL 2 % MT SOLN
15.0000 mL | Freq: Once | OROMUCOSAL | Status: AC
  Administered 2023-09-16: 15 mL via ORAL
  Filled 2023-09-16: qty 15

## 2023-09-16 MED ORDER — ONDANSETRON HCL 4 MG/2ML IJ SOLN
4.0000 mg | Freq: Once | INTRAMUSCULAR | Status: AC
  Administered 2023-09-16: 4 mg via INTRAVENOUS
  Filled 2023-09-16: qty 2

## 2023-09-16 MED ORDER — KETOROLAC TROMETHAMINE 15 MG/ML IJ SOLN
15.0000 mg | Freq: Once | INTRAMUSCULAR | Status: AC
  Administered 2023-09-16: 15 mg via INTRAVENOUS
  Filled 2023-09-16: qty 1

## 2023-09-16 MED ORDER — ONDANSETRON 4 MG PO TBDP: 4.0000 mg | ORAL_TABLET | Freq: Two times a day (BID) | ORAL | 0 refills | Status: DC | PRN

## 2023-09-16 MED ORDER — DROPERIDOL 2.5 MG/ML IJ SOLN
2.5000 mg | Freq: Once | INTRAMUSCULAR | Status: AC
  Administered 2023-09-16: 2.5 mg via INTRAVENOUS
  Filled 2023-09-16: qty 2

## 2023-09-16 MED ORDER — MORPHINE SULFATE (PF) 4 MG/ML IV SOLN
4.0000 mg | Freq: Once | INTRAVENOUS | Status: AC
  Administered 2023-09-16: 4 mg via INTRAVENOUS
  Filled 2023-09-16: qty 1

## 2023-09-16 MED ORDER — SODIUM CHLORIDE 0.9 % IV BOLUS
1000.0000 mL | Freq: Once | INTRAVENOUS | Status: AC
  Administered 2023-09-16: 1000 mL via INTRAVENOUS

## 2023-09-16 MED ORDER — HALOPERIDOL LACTATE 5 MG/ML IJ SOLN
5.0000 mg | Freq: Once | INTRAMUSCULAR | Status: AC
  Administered 2023-09-16: 5 mg via INTRAVENOUS
  Filled 2023-09-16: qty 1

## 2023-09-16 MED ORDER — PANTOPRAZOLE SODIUM 40 MG IV SOLR
40.0000 mg | Freq: Once | INTRAVENOUS | Status: AC
  Administered 2023-09-16: 40 mg via INTRAVENOUS
  Filled 2023-09-16: qty 10

## 2023-09-16 MED ORDER — ALUM & MAG HYDROXIDE-SIMETH 200-200-20 MG/5ML PO SUSP
30.0000 mL | Freq: Once | ORAL | Status: AC
  Administered 2023-09-16: 30 mL via ORAL
  Filled 2023-09-16: qty 30

## 2023-09-16 MED ORDER — IOHEXOL 350 MG/ML SOLN
100.0000 mL | Freq: Once | INTRAVENOUS | Status: AC | PRN
  Administered 2023-09-16: 100 mL via INTRAVENOUS

## 2023-09-16 NOTE — Discharge Instructions (Addendum)
 Take zofran  for nausea as needed.   Your QT interval on your EKG was slightly increased.  Please talk to your doctor about having this rechecked at a future appointment.  Talk to your doctor about your [facility] dosing adjustments as needed.  Call surgeon Dr Rosea Conch for an appointment to discuss your gallstones and potential remedies including surgery at a later date  Thank you for choosing us  for your health care today!  Please see your primary doctor this week for a follow up appointment.   If you have any new, worsening, or unexpected symptoms call your doctor right away or come back to the emergency department for reevaluation.  It was my pleasure to care for you today.   Arron Large Margery Sheets, MD

## 2023-09-16 NOTE — ED Triage Notes (Addendum)
 Pt to ed from home via POV for CP and emesis. Pt was already seen today for same. Pt was treated and was told to return if no better. Pt started WEGOVVY on Tuesday. Pt is caox4, in no acute distress in triage. Pt has taken her prescribed zofran  at home at 7pm. Pt actively vomiting in triage. Pt had labs collected last night.

## 2023-09-16 NOTE — ED Provider Notes (Signed)
 [facility] Provider Note    Event Date/Time   First MD Initiated Contact with Patient 09/16/23 2208     (approximate)   History   Chest Pain   HPI  Diane Moss is a 28 y.o. female  who returns to the emergency department today because of concern for returning nausea, vomiting and chest pain. The patient was seen in the emergency department last night for these symptoms.  She was feeling better at discharge however the symptoms returned.  She for started feeling the tightness and discomfort in her chest.  This then caused her to have nausea and vomiting.  She denies any blood in any of the emesis.  She has felt hot.  Prior to the symptoms started yesterday she has never had similar symptoms in the past.     Physical Exam   Triage Vital Signs: ED Triage Vitals  Encounter Vitals Group     BP 09/16/23 2156 (S) (!) 135/105     Systolic BP Percentile --      Diastolic BP Percentile --      Pulse Rate 09/16/23 2156 89     Resp 09/16/23 2156 16     Temp 09/16/23 2156 98 F (36.7 C)     Temp Source 09/16/23 2156 Oral     SpO2 09/16/23 2156 100 %     Weight --      Height 09/16/23 2157 5\' 6"  (1.676 m)     Head Circumference --      Peak Flow --      Pain Score 09/16/23 2157 5     Pain Loc --      Pain Education --      Exclude from Growth Chart --     Most recent vital signs: Vitals:   09/16/23 2156  BP: (S) (!) 135/105  Pulse: 89  Resp: 16  Temp: 98 F (36.7 C)  SpO2: 100%   General: Awake, alert, oriented. CV:  Good peripheral perfusion. Regular rate and rhythm. Resp:  Normal effort. Lungs clear. Abd:  No distention. Mild tenderness in epigastric region.   ED Results / Procedures / Treatments   Labs (all labs ordered are listed, but only abnormal results are displayed) Labs Reviewed  COMPREHENSIVE METABOLIC PANEL WITH GFR - Abnormal; Notable for the following components:      Result Value   Potassium 3.4 (*)    Glucose, Bld 120  (*)    Creatinine, Ser 1.15 (*)    All other components within normal limits  CBC WITH DIFFERENTIAL/PLATELET - Abnormal; Notable for the following components:   Platelets 409 (*)    All other components within normal limits  CBG MONITORING, ED - Abnormal; Notable for the following components:   Glucose-Capillary 110 (*)    All other components within normal limits  LIPASE, BLOOD     EKG  None   RADIOLOGY RUQ US  pending at time of sign out  PROCEDURES:  Critical Care performed: No   MEDICATIONS ORDERED IN ED: Medications - No data to display   IMPRESSION / MDM / ASSESSMENT AND PLAN / ED COURSE  I reviewed the triage vital signs and the nursing notes.                              Differential diagnosis includes, but is not limited to, gallstones, pancreatitis, gastritis, esophagitis  Patient's presentation is most consistent with acute presentation  with potential threat to life or bodily function.   The patient is on the cardiac monitor to evaluate for evidence of arrhythmia and/or significant heart rate changes.  Patient returns to the emergency department today with nausea vomiting and chest pain.  Was seen in the emergency department last night for similar symptoms.  At that time had CT scan which did not show any obvious acute findings although did raise concerns for gallstones.  Blood work was reassuring at that time.  On exam here patient is mildly tender in the epigastric region.  Will recheck blood work.  Additionally will get right upper quadrant ultrasound to further evaluate gallbladder and gallstones.  Blood work without concerning leukocytosis, LFT elevation or lipase elevation.  Patient without significant relief after first round of medications.  Will try Haldol  to see if that affects a different pathway for the patient.  Ultrasound pending at time of signout.      FINAL CLINICAL IMPRESSION(S) / ED DIAGNOSES   Final diagnoses:  Nausea and vomiting,  unspecified vomiting type  Chest pain, unspecified type      Note:  This document was prepared using Dragon voice recognition software and may include unintentional dictation errors.    Marylynn Soho, MD 09/16/23 [phone]

## 2023-09-16 NOTE — ED Notes (Addendum)
 Pt to triage for reassessment; actively vomiting, diaphoretic; c/o mid CP that radiates down into upper abd--tenderness with palpation to mid upper abd

## 2023-09-16 NOTE — ED Provider Notes (Addendum)
 [facility] Provider Note    Event Date/Time   First MD Initiated Contact with Patient 09/16/23 [phone]     (approximate)   History   Chest Pain   HPI  Diane Moss is a 28 y.o. female   Past medical history of no significant past medical history presents to the emergency department with epigastric pain and nausea and vomiting starting around noon time this morning.  She was in her regular state of health beforehand, but notes a recent increase in her Wegovy dosing a couple of days ago.  She has vomited a couple times.  She states that the pain radiates from her upper abdomen into her chest.  With deep breaths it irritates her upper abdomen.    Independent Historian contributed to assessment above: Her partner is at bedside to corroborate information past medical history as above    Physical Exam   Triage Vital Signs: ED Triage Vitals  Encounter Vitals Group     BP 09/15/23 2251 (!) 153/113     Systolic BP Percentile --      Diastolic BP Percentile --      Pulse Rate 09/15/23 2251 94     Resp 09/15/23 2251 20     Temp 09/15/23 2251 98.7 F (37.1 C)     Temp Source 09/15/23 2251 Oral     SpO2 09/15/23 2251 96 %     Weight 09/15/23 2248 240 lb (108.9 kg)     Height 09/15/23 2248 5\' 6"  (1.676 m)     Head Circumference --      Peak Flow --      Pain Score 09/15/23 2248 10     Pain Loc --      Pain Education --      Exclude from Growth Chart --     Most recent vital signs: Vitals:   09/16/23 0404 09/16/23 0430  BP: (!) 156/100 134/89  Pulse: 99 (!) 107  Resp: 15 20  Temp: 98 F (36.7 C)   SpO2: 100% 98%    General: Awake, no distress.  CV:  Good peripheral perfusion.  Resp:  Normal effort.  Abd:  No distention.  Other:  She looks uncomfortable.  She is hypertensive and slightly tachycardic.  She has some epigastric tenderness to palpation without rigidity or guarding.   ED Results / Procedures / Treatments   Labs (all labs  ordered are listed, but only abnormal results are displayed) Labs Reviewed  CBC - Abnormal; Notable for the following components:      Result Value   WBC 10.7 (*)    All other components within normal limits  BASIC METABOLIC PANEL WITH GFR  HEPATIC FUNCTION PANEL  HCG, QUANTITATIVE, PREGNANCY  MAGNESIUM  POC URINE PREG, ED  TROPONIN I (HIGH SENSITIVITY)  TROPONIN I (HIGH SENSITIVITY)     I ordered and reviewed the above labs they are notable for white blood cell count slightly elevated 10.7 but LFTs are normal.  She is not pregnant.  EKG  ED ECG REPORT I, Buell Carmin, the attending physician, personally viewed and interpreted this ECG.   Date: 09/16/2023  EKG Time: 0104  Rate: 86  Rhythm: sinus  Axis: nl  Intervals:long qtc  ST&T Change: no stemi    RADIOLOGY I independently reviewed and interpreted chest x-ray and I see no obvious focality or pneumothorax I also reviewed radiologist's formal read.   PROCEDURES:  Critical Care performed: No  Procedures   MEDICATIONS ORDERED  IN ED: Medications  ondansetron  (ZOFRAN ) injection 4 mg (4 mg Intravenous Given 09/16/23 0040)  sodium chloride  0.9 % bolus 1,000 mL (0 mLs Intravenous Stopped 09/16/23 0155)  ondansetron  (ZOFRAN ) injection 4 mg (4 mg Intravenous Given 09/16/23 0057)  morphine  (PF) 4 MG/ML injection 4 mg (4 mg Intravenous Given 09/16/23 0155)  ketorolac  (TORADOL ) 15 MG/ML injection 15 mg (15 mg Intravenous Given 09/16/23 0153)  alum & mag hydroxide-simeth (MAALOX/MYLANTA) 200-200-20 MG/5ML suspension 30 mL (30 mLs Oral Given 09/16/23 0209)    And  lidocaine  (XYLOCAINE ) 2 % viscous mouth solution 15 mL (15 mLs Oral Given 09/16/23 0209)  droperidol  (INAPSINE ) 2.5 MG/ML injection 2.5 mg (2.5 mg Intravenous Given 09/16/23 0207)  iohexol  (OMNIPAQUE ) 350 MG/ML injection 100 mL (100 mLs Intravenous Contrast Given 09/16/23 0224)     IMPRESSION / MDM / ASSESSMENT AND PLAN / ED COURSE  I reviewed the triage vital signs and  the nursing notes.                                Patient's presentation is most consistent with acute presentation with potential threat to life or bodily function.  Differential diagnosis includes, but is not limited to, adverse effect of Wegovy medication, intra-abdominal infection or obstruction, gastritis/ulcer/perforation, biliary pathologies like cholecystitis or biliary colic, pancreatitis, appendicitis   The patient is on the cardiac monitor to evaluate for evidence of arrhythmia and/or significant heart rate changes.  MDM:    She looks quite uncomfortable and has been nauseated and vomiting with abdominal pain in the upper central area for 1 day.  She has some tenderness to the area as well so we will proceed with CT scan of the abdomen pelvis to check for any surgical abdominal pathologies.  In the meantime give IV morphine , antiemetic, GI cocktail.   May also be the result of her increased Wegovy dosing.  Her symptoms have resolved and he is resting comfortably.  Reexamination of the abdomen is benign, nontender in all quadrants, negative Murphy sign.  She did have gallstones on CT imaging but no evidence of cholecystitis or biliary dilation and together with a benign abdominal exam on reexamination I doubt there is any surgical pathology at this time the cholecystitis.  Given the results of the incidental finding and have her follow-up with general surgeon for further management as needed.  Also asked her to talk to her primary doctor about adjusting Wegovy dosing as this may be contributing.  She was slightly prolonged QTc interval on initial EKG, with not quite as high on repeat EKG.  I informed her of this finding.  I limited her prescription of Zofran  to just a few doses in the setting of slightly prolonged QTc, as this dose I do not think will significantly alter her intervals..  Plan is for discharge.      FINAL CLINICAL IMPRESSION(S) / ED DIAGNOSES   Final diagnoses:   Pain of upper abdomen  Nausea and vomiting, unspecified vomiting type  Calculus of gallbladder without cholecystitis without obstruction  QT prolongation     Rx / DC Orders   ED Discharge Orders          Ordered    ondansetron  (ZOFRAN -ODT) 4 MG disintegrating tablet  2 times daily PRN        09/16/23 0514             Note:  This document was prepared using Dragon voice recognition  software and may include unintentional dictation errors.    Buell Carmin, MD 09/16/23 Evelene Hint    Buell Carmin, MD 09/16/23 [phone]

## 2023-09-17 LAB — D-DIMER, QUANTITATIVE: D-Dimer, Quant: 0.34 ug{FEU}/mL (ref 0.00–0.50)

## 2023-09-17 LAB — TROPONIN I (HIGH SENSITIVITY): Troponin I (High Sensitivity): 16 ng/L (ref <18)

## 2023-09-17 MED ORDER — ALUM & MAG HYDROXIDE-SIMETH 200-200-20 MG/5ML PO SUSP
15.0000 mL | Freq: Once | ORAL | Status: AC
  Administered 2023-09-17: 15 mL via ORAL
  Filled 2023-09-17: qty 30

## 2023-09-17 MED ORDER — FAMOTIDINE 20 MG PO TABS
20.0000 mg | ORAL_TABLET | Freq: Once | ORAL | Status: AC
  Administered 2023-09-17: 20 mg via ORAL
  Filled 2023-09-17: qty 1

## 2023-09-17 MED ORDER — PROMETHAZINE HCL 12.5 MG PO TABS: 12.5000 mg | ORAL_TABLET | Freq: Four times a day (QID) | ORAL | 0 refills | Status: DC | PRN

## 2023-09-17 NOTE — Discharge Instructions (Addendum)
 No driving this morning as you received medication that can be sedating.  Please return to the emergency room right away if you are to develop a fever, severe nausea, your pain becomes severe or worsens, you are unable to keep food down, begin vomiting any dark or bloody fluid, you develop any dark or bloody stools, feel dehydrated, or other new concerns or symptoms arise.

## 2023-09-17 NOTE — ED Provider Notes (Signed)
 Patient received in signout by Dr. Hendrick Locke.  Patient being evaluated for a symptom of abdominal pain or chest pain.  Some suspicion it may be related to use of Wegovy.  Awaiting D-dimer.  Her D-dimer has resulted and is normal/exclusionary for PE   ----------------------------------------- 2:04 AM on 09/17/2023 -----------------------------------------  Patient alert oriented.  Family at the bedside.  Reviewed her results at this point with her.  Her workup thus far reassuring including her evaluation and CT yesterday as well.  It seems most likely based on her history that this is potential side effect of Wegovy.  She is awake alert in no acute distress or extremis and her symptoms are much more controlled.  She does complain of a burning sensation in the esophageal region.  Discussed with patient recommendation for over-the-counter use of antacids.  Careful return precautions.  Will also prescribe Phenergan  which may be used.  She will contact her doctors office this week, and advised her to withhold use of further Wegovy until she is spoken with her doctor.  Return precautions and treatment recommendations and follow-up discussed with the patient who is agreeable with the plan.   Vitals:   09/16/23 2156  BP: (S) (!) 135/105  Pulse: 89  Resp: 16  Temp: 98 F (36.7 C)  SpO2: 100%       Iver Marker, MD 09/17/23 [phone]

## 2023-09-19 ENCOUNTER — Encounter: Payer: Self-pay | Admitting: Surgery

## 2023-09-19 ENCOUNTER — Other Ambulatory Visit: Payer: Self-pay

## 2023-09-19 ENCOUNTER — Inpatient Hospital Stay
Admission: AD | Admit: 2023-09-19 | Discharge: 2023-09-22 | DRG: 392 | Disposition: A | Discharge status: Home / Self Care | Source: Ambulatory Visit | Attending: Internal Medicine | Admitting: Internal Medicine

## 2023-09-19 ENCOUNTER — Encounter (HOSPITAL_COMMUNITY): Payer: Self-pay

## 2023-09-19 DIAGNOSIS — K810 Acute cholecystitis: Principal | ICD-10-CM | POA: Diagnosis present

## 2023-09-19 DIAGNOSIS — R0789 Other chest pain: Secondary | ICD-10-CM | POA: Diagnosis present

## 2023-09-19 DIAGNOSIS — K2970 Gastritis, unspecified, without bleeding: Principal | ICD-10-CM | POA: Diagnosis present

## 2023-09-19 DIAGNOSIS — Z809 Family history of malignant neoplasm, unspecified: Secondary | ICD-10-CM

## 2023-09-19 DIAGNOSIS — Z79890 Hormone replacement therapy (postmenopausal): Secondary | ICD-10-CM

## 2023-09-19 DIAGNOSIS — R112 Nausea with vomiting, unspecified: Secondary | ICD-10-CM

## 2023-09-19 DIAGNOSIS — Z8049 Family history of malignant neoplasm of other genital organs: Secondary | ICD-10-CM

## 2023-09-19 DIAGNOSIS — F32A Depression, unspecified: Secondary | ICD-10-CM | POA: Diagnosis present

## 2023-09-19 DIAGNOSIS — Z9102 Food additives allergy status: Secondary | ICD-10-CM

## 2023-09-19 DIAGNOSIS — Z823 Family history of stroke: Secondary | ICD-10-CM

## 2023-09-19 DIAGNOSIS — Z813 Family history of other psychoactive substance abuse and dependence: Secondary | ICD-10-CM

## 2023-09-19 DIAGNOSIS — Z825 Family history of asthma and other chronic lower respiratory diseases: Secondary | ICD-10-CM

## 2023-09-19 DIAGNOSIS — E669 Obesity, unspecified: Secondary | ICD-10-CM | POA: Diagnosis present

## 2023-09-19 DIAGNOSIS — Z821 Family history of blindness and visual loss: Secondary | ICD-10-CM

## 2023-09-19 DIAGNOSIS — K819 Cholecystitis, unspecified: Secondary | ICD-10-CM | POA: Diagnosis present

## 2023-09-19 DIAGNOSIS — E876 Hypokalemia: Secondary | ICD-10-CM | POA: Diagnosis present

## 2023-09-19 DIAGNOSIS — Z818 Family history of other mental and behavioral disorders: Secondary | ICD-10-CM

## 2023-09-19 DIAGNOSIS — Z8249 Family history of ischemic heart disease and other diseases of the circulatory system: Secondary | ICD-10-CM

## 2023-09-19 DIAGNOSIS — K449 Diaphragmatic hernia without obstruction or gangrene: Secondary | ICD-10-CM | POA: Diagnosis present

## 2023-09-19 DIAGNOSIS — K5900 Constipation, unspecified: Secondary | ICD-10-CM | POA: Diagnosis present

## 2023-09-19 DIAGNOSIS — Z7985 Long-term (current) use of injectable non-insulin antidiabetic drugs: Secondary | ICD-10-CM

## 2023-09-19 DIAGNOSIS — Z309 Encounter for contraceptive management, unspecified: Secondary | ICD-10-CM

## 2023-09-19 DIAGNOSIS — Z833 Family history of diabetes mellitus: Secondary | ICD-10-CM

## 2023-09-19 DIAGNOSIS — Z83438 Family history of other disorder of lipoprotein metabolism and other lipidemia: Secondary | ICD-10-CM

## 2023-09-19 DIAGNOSIS — F419 Anxiety disorder, unspecified: Secondary | ICD-10-CM | POA: Diagnosis present

## 2023-09-19 DIAGNOSIS — Z79899 Other long term (current) drug therapy: Secondary | ICD-10-CM

## 2023-09-19 DIAGNOSIS — Z6838 Body mass index (BMI) 38.0-38.9, adult: Secondary | ICD-10-CM

## 2023-09-19 LAB — HEPATIC FUNCTION PANEL
ALT: 72 U/L — ABNORMAL HIGH (ref 0–44)
AST: 48 U/L — ABNORMAL HIGH (ref 15–41)
Albumin: 3.9 g/dL (ref 3.5–5.0)
Alkaline Phosphatase: 55 U/L (ref 38–126)
Bilirubin, Direct: 0.3 mg/dL — ABNORMAL HIGH (ref 0.0–0.2)
Indirect Bilirubin: 0.5 mg/dL (ref 0.3–0.9)
Total Bilirubin: 0.8 mg/dL (ref 0.0–1.2)
Total Protein: 7.2 g/dL (ref 6.5–8.1)

## 2023-09-19 LAB — CBC
HCT: 39.1 % (ref 36.0–46.0)
Hemoglobin: 13.2 g/dL (ref 12.0–15.0)
MCH: 28.7 pg (ref 26.0–34.0)
MCHC: 33.8 g/dL (ref 30.0–36.0)
MCV: 85 fL (ref 80.0–100.0)
Platelets: 341 10*3/uL (ref 150–400)
RBC: 4.6 MIL/uL (ref 3.87–5.11)
RDW: 12.5 % (ref 11.5–15.5)
WBC: 11.9 10*3/uL — ABNORMAL HIGH (ref 4.0–10.5)
nRBC: 0 % (ref 0.0–0.2)

## 2023-09-19 LAB — BASIC METABOLIC PANEL WITH GFR
Anion gap: 10 (ref 5–15)
BUN: 13 mg/dL (ref 6–20)
CO2: 24 mmol/L (ref 22–32)
Calcium: 9.2 mg/dL (ref 8.9–10.3)
Chloride: 99 mmol/L (ref 98–111)
Creatinine, Ser: 0.91 mg/dL (ref 0.44–1.00)
GFR, Estimated: >60 mL/min (ref >60)
Glucose, Bld: 90 mg/dL (ref 70–99)
Potassium: 3.4 mmol/L — ABNORMAL LOW (ref 3.5–5.1)
Sodium: 133 mmol/L — ABNORMAL LOW (ref 135–145)

## 2023-09-19 MED ORDER — ONDANSETRON 4 MG PO TBDP
4.0000 mg | ORAL_TABLET | Freq: Four times a day (QID) | ORAL | Status: DC | PRN
  Administered 2023-09-19: 4 mg via ORAL
  Filled 2023-09-19: qty 1

## 2023-09-19 MED ORDER — BISACODYL 10 MG RE SUPP
10.0000 mg | Freq: Once | RECTAL | Status: AC
  Administered 2023-09-19: 10 mg via RECTAL
  Filled 2023-09-19: qty 1

## 2023-09-19 MED ORDER — HYDROCODONE-ACETAMINOPHEN 5-325 MG PO TABS
1.0000 | ORAL_TABLET | ORAL | Status: DC | PRN
  Administered 2023-09-19: 1 via ORAL
  Filled 2023-09-19: qty 1

## 2023-09-19 MED ORDER — TRAMADOL HCL 50 MG PO TABS: 50.0000 mg | ORAL_TABLET | Freq: Four times a day (QID) | ORAL | Status: DC | PRN

## 2023-09-19 MED ORDER — DOCUSATE SODIUM 100 MG PO CAPS
100.0000 mg | ORAL_CAPSULE | Freq: Two times a day (BID) | ORAL | Status: DC | PRN
  Administered 2023-09-19 – 2023-09-21 (×4): 100 mg via ORAL
  Filled 2023-09-19 (×4): qty 1

## 2023-09-19 MED ORDER — POLYETHYLENE GLYCOL 3350 17 G PO PACK
17.0000 g | PACK | Freq: Every day | ORAL | Status: DC
  Administered 2023-09-19 – 2023-09-21 (×3): 17 g via ORAL
  Filled 2023-09-19 (×5): qty 1

## 2023-09-19 MED ORDER — SERTRALINE HCL 50 MG PO TABS: 100.0000 mg | ORAL_TABLET | Freq: Every day | ORAL | Status: DC

## 2023-09-19 MED ORDER — ONDANSETRON HCL 4 MG/2ML IJ SOLN
4.0000 mg | Freq: Four times a day (QID) | INTRAMUSCULAR | Status: DC | PRN
Start: 2023-09-19 — End: 2023-09-22
  Administered 2023-09-20 – 2023-09-22 (×4): 4 mg via INTRAVENOUS
  Filled 2023-09-19 (×5): qty 2

## 2023-09-19 MED ORDER — SODIUM CHLORIDE 0.9 % IV SOLN: INTRAVENOUS | Status: AC

## 2023-09-19 MED ORDER — ESCITALOPRAM OXALATE 10 MG PO TABS
20.0000 mg | ORAL_TABLET | Freq: Every day | ORAL | Status: DC
  Administered 2023-09-19 – 2023-09-22 (×4): 20 mg via ORAL
  Filled 2023-09-19 (×4): qty 2

## 2023-09-19 NOTE — H&P (Signed)
 Subjective:   CC: abdominal pain  HPI:  Diane Moss is a 28 y.o. female who was referred by Buell Carmin, MD from [facility] ED for evaluation of above CC. Symptoms were first noted Thursday, 09/15/23. Her symptoms consist of nausea, vomiting, and abdominal pain.  Pain is constant. Patient reports she has not been able to eat or drink since Thursday. Patient went to [facility] on Friday, 09/16/23 where labs and imaging was ordered. Labs were normal and imaging showed cholelithiasis with no other acute findings. Patient then went to ED at [facility] on Saturday, 09/17/23 and was given fluids, antiemetics and pain control. Patient was discharged with phenergan . Patient has been taking oxycodone at home with some improvement. She has has tried taking phenergan  and Zofran  for nausea with no change. She reports her last bowel movement was between Wednesday-Thursday last week. She regularly has BM. She has also tried taking docusate suspension with no improvement. However, she does admit to passing gas. Her last menstrual cycle was 04/15. She denies any fevers or chills. Denies being around anyone who has been sick recently. Her last Wegovy dose of 1.7 mg was Tuesday, 09/13/23.  Past Medical History:  has a past medical history of Allergy, Anxiety, Depression, Headache, History of benign pituitary tumor, Kidney stones, and Migraines.  Past Surgical History:  has a past surgical history that includes Lithotripsy (09/2013) and Lithotripsy (07/2018).  Family History: family history includes Allergies in her brother, father, and mother; Anxiety in her mother; Asthma in her brother; Depression in her father; Diabetes type II in her maternal aunt; Drug abuse in her father; High blood pressure (Hypertension) in her mother; Hyperlipidemia (Elevated cholesterol) in her mother; Obesity in her maternal grandfather; Peripheral Vascular Disease (PVD or blocked arteries in arms and legs) in her maternal grandmother; Prostate  cancer in her paternal grandfather; Stroke in her maternal grandfather; Uterine cancer in her mother; Vision loss in her paternal grandmother.  Social History:  reports that she has never smoked. She has never used smokeless tobacco. She reports that she does not currently use alcohol. She reports that she does not use drugs.  Current Medications: has a current medication list which includes the following prescription(s): bupropion, escitalopram oxalate, levothyroxine, magnesium oxide, nortrel  7/7/7 (28), nortriptyline, ondansetron , ondansetron , and oxycodone.  Allergies:  Allergies as of 09/19/2023 - Reviewed 09/19/2023  Allergen Reaction Noted   Peach flavor Rash and Swelling 10/23/2013    ROS:  A 15 point review of systems was performed and pertinent positives and negatives noted in HPI    Objective:     BP (!) 133/97   Pulse 86   Ht 167.6 cm (5\' 6" )   Wt (!) 108 kg (238 lb)   LMP 09/06/2023   BMI 38.41 kg/m    Constitutional :  No distress, cooperative, alert           Gastrointestinal: Soft, tender on epigastric and hypogastric region upon palpation, non-distended, no organomegaly.  Musculoskeletal: Steady gait and movement  Skin: Cool and moist  Psychiatric: Normal affect, non-agitated, not confused       LABS:  Results for orders placed or performed during the hospital encounter of 09/17/23   CBC w/ Differential  Result Value Ref Range  WBC 8.1 3.6 - 11.2 10*9/L  RBC 4.46 3.95 - 5.13 10*12/L  HGB 12.9 11.3 - 14.9 g/dL  HCT 40.9 81.1 - 91.4 %  MCV 84.6 77.6 - 95.7 fL  MCH 28.9 25.9 - 32.4 pg  MCHC  34.1 32.0 - 36.0 g/dL  RDW 16.1 09.6 - 04.5 %  MPV 8.0 6.8 - 10.7 fL  Platelet 361 150 - 450 10*9/L  nRBC 0 <=4 /100 WBCs  Neutrophils % 67.6 %  Lymphocytes % 24.8 %  Monocytes % 6.8 %  Eosinophils % 0.0 %  Basophils % 0.8 %  Absolute Neutrophils 5.4 1.8 - 7.8 10*9/L  Absolute Lymphocytes 2.0 1.1 - 3.6 10*9/L  Absolute Monocytes 0.5 0.3 - 0.8 10*9/L   Absolute Eosinophils 0.0 0.0 - 0.5 10*9/L  Absolute Basophils 0.1 0.0 - 0.1 10*9/L   Comprehensive Metabolic Panel  Result Value Ref Range  Sodium 140 135 - 145 mmol/L  Potassium 3.7 3.4 - 4.8 mmol/L  Chloride 101 98 - 107 mmol/L  CO2 25.3 20.0 - 31.0 mmol/L  Anion Gap 14 5 - 14 mmol/L  BUN 9 9 - 23 mg/dL  Creatinine 4.09 8.11 - 1.02 mg/dL  BUN/Creatinine Ratio 12  eGFR CKD-EPI (2021) Female >90 >=60 mL/min/1.31m2  Glucose 98 70 - 179 mg/dL  Calcium 9.9 8.7 - 91.4 mg/dL  Albumin 3.8 3.4 - 5.0 g/dL  Total Protein 7.4 5.7 - 8.2 g/dL  Total Bilirubin 0.7 0.3 - 1.2 mg/dL  AST 40 (H) <=78 U/L  ALT 45 10 - 49 U/L  Alkaline Phosphatase 65 46 - 116 U/L  Lipase  Result Value Ref Range  Lipase 38 12 - 53 U/L   RADS: ULTRASOUND ABDOMEN 09/15/2023 FINDINGS:  Gallbladder:   Multiple mobile calcified gallstones and gallbladder sludge within  the gallbladder lumen. No gallbladder wall thickening or  pericholecystic fluid visualized. Unable to evaluate for sonographic  Murphy sign given premedication.   Common bile duct:   Diameter: 4 mm   Liver:   No focal lesion identified. Increased parenchymal echogenicity.  Portal vein is patent on color Doppler imaging with normal direction  of blood flow towards the liver.   Other: None.   IMPRESSION:  1. Cholelithiasis and gallbladder sludge with no acute  cholecystitis.  2. Hepatic steatosis. Please note limited evaluation for focal  hepatic masses in a patient with hepatic steatosis due to decreased  penetration of the acoustic ultrasound waves.   CT ABDOMEN AND PELVIS WITH CONTRAST 09/16/2023 FINDINGS:  Lower chest: No acute abnormality.   Hepatobiliary: There is diffuse fatty infiltration of the liver  parenchyma. No focal liver abnormality is seen. Numerous  subcentimeter low-attenuation gallstones are seen without evidence  of gallbladder wall thickening, pericholecystic inflammation or  biliary dilatation.   Pancreas:  Unremarkable. No pancreatic ductal dilatation or  surrounding inflammatory changes.   Spleen: Normal in size without focal abnormality.   Adrenals/Urinary Tract: Adrenal glands are unremarkable. Kidneys are  normal in size, without renal calculi or hydronephrosis. A 7 mm  diameter simple cyst is seen within the left kidney. Bladder is  unremarkable.   Stomach/Bowel: There is a small hiatal hernia. Appendix appears  normal. No evidence of bowel wall thickening, distention, or  inflammatory changes. Noninflamed diverticula are seen throughout  the large bowel.   Vascular/Lymphatic: No significant vascular findings are present. No  enlarged abdominal or pelvic lymph nodes.   Reproductive: Uterus and bilateral adnexa are unremarkable.   Other: No abdominal wall hernia or abnormality. No abdominopelvic  ascites.   Musculoskeletal: No acute or significant osseous findings.   IMPRESSION:  1. Cholelithiasis.  2. Small hiatal hernia.  3. Colonic diverticulosis.  4. Hepatic steatosis.  5. Subcentimeter simple left renal cyst. No follow-up imaging is  recommended. This  recommendation follows ACR consensus guidelines:  Management of the Incidental Renal Mass on CT: A White Paper of the  ACR Incidental Findings Committee. J Am Coll Radiol [phone].   CHEST-2 VIEW 09/16/2023 FINDINGS:  Cardiac silhouette is unremarkable. No pneumothorax or pleural  effusion. The lungs are clear. The visualized skeletal structures  are unremarkable.   IMPRESSION:  No acute cardiopulmonary process.   Assessment:     1. Unspecified abdominal pain (R10.9): Clinical history and physical exam is not consistent with the typical presentation of gallbladder issues. Patient has been experiencing constant abdominal pain and persistent vomiting since for about four days now whereas acute cholecystitis presents as  more intermittent pain. Labs studies such as total bilirubin and alkaline phosphatase were  all normal. Recommended more studies such as HIDA scan to further evaluate gallbladder. Discussed gallbladder surgery as an option. However, discussed with patient that surgery may not resolve the abdominal pain since the gallbladder may not be the source.   2. Nausea with vomiting (R11.2): Patient denies not being able to eat or drink since Thursday. She has has tried taking phenergan  and Zofran  for nausea with no improvement. Although, patient did not appear dehydrated on physical exam, recommend admission for IV fluids and antiemetic medications.   Plan:     1. Unspecified abdominal pain (R10.9): Discussed cholecystectomy. Recommended HIDA scan for further evaluation. Patient would like to proceed with HIDA scan. If HIDA scan confirms gallbladder being the source of the abdominal pain, will proceed with surgery.   2. Nausea with vomiting (R11.2): Patient will be admitted for IV fluids and antiemetics. Informed [facility] and they will call patient once bed is available.

## 2023-09-19 NOTE — Progress Notes (Signed)
 BP 154/90. Dr Rosea Conch notified. No new orders at this time

## 2023-09-20 ENCOUNTER — Observation Stay

## 2023-09-20 DIAGNOSIS — Z79890 Hormone replacement therapy (postmenopausal): Secondary | ICD-10-CM | POA: Diagnosis not present

## 2023-09-20 DIAGNOSIS — Z821 Family history of blindness and visual loss: Secondary | ICD-10-CM | POA: Diagnosis not present

## 2023-09-20 DIAGNOSIS — E669 Obesity, unspecified: Secondary | ICD-10-CM | POA: Diagnosis present

## 2023-09-20 DIAGNOSIS — R112 Nausea with vomiting, unspecified: Secondary | ICD-10-CM | POA: Diagnosis not present

## 2023-09-20 DIAGNOSIS — Z6838 Body mass index (BMI) 38.0-38.9, adult: Secondary | ICD-10-CM | POA: Diagnosis not present

## 2023-09-20 DIAGNOSIS — R0789 Other chest pain: Secondary | ICD-10-CM | POA: Diagnosis present

## 2023-09-20 DIAGNOSIS — Z83438 Family history of other disorder of lipoprotein metabolism and other lipidemia: Secondary | ICD-10-CM | POA: Diagnosis not present

## 2023-09-20 DIAGNOSIS — Z825 Family history of asthma and other chronic lower respiratory diseases: Secondary | ICD-10-CM | POA: Diagnosis not present

## 2023-09-20 DIAGNOSIS — K819 Cholecystitis, unspecified: Secondary | ICD-10-CM | POA: Diagnosis present

## 2023-09-20 DIAGNOSIS — Z809 Family history of malignant neoplasm, unspecified: Secondary | ICD-10-CM | POA: Diagnosis not present

## 2023-09-20 DIAGNOSIS — K449 Diaphragmatic hernia without obstruction or gangrene: Secondary | ICD-10-CM | POA: Diagnosis present

## 2023-09-20 DIAGNOSIS — Z8249 Family history of ischemic heart disease and other diseases of the circulatory system: Secondary | ICD-10-CM | POA: Diagnosis not present

## 2023-09-20 DIAGNOSIS — Z823 Family history of stroke: Secondary | ICD-10-CM | POA: Diagnosis not present

## 2023-09-20 DIAGNOSIS — F32A Depression, unspecified: Secondary | ICD-10-CM | POA: Diagnosis present

## 2023-09-20 DIAGNOSIS — E876 Hypokalemia: Secondary | ICD-10-CM | POA: Insufficient documentation

## 2023-09-20 DIAGNOSIS — K8000 Calculus of gallbladder with acute cholecystitis without obstruction: Secondary | ICD-10-CM | POA: Diagnosis present

## 2023-09-20 DIAGNOSIS — Z813 Family history of other psychoactive substance abuse and dependence: Secondary | ICD-10-CM | POA: Diagnosis not present

## 2023-09-20 DIAGNOSIS — Z9102 Food additives allergy status: Secondary | ICD-10-CM | POA: Diagnosis not present

## 2023-09-20 DIAGNOSIS — Z818 Family history of other mental and behavioral disorders: Secondary | ICD-10-CM | POA: Diagnosis not present

## 2023-09-20 DIAGNOSIS — K810 Acute cholecystitis: Secondary | ICD-10-CM | POA: Diagnosis not present

## 2023-09-20 DIAGNOSIS — Z833 Family history of diabetes mellitus: Secondary | ICD-10-CM | POA: Diagnosis not present

## 2023-09-20 DIAGNOSIS — F419 Anxiety disorder, unspecified: Secondary | ICD-10-CM | POA: Diagnosis present

## 2023-09-20 DIAGNOSIS — Z7985 Long-term (current) use of injectable non-insulin antidiabetic drugs: Secondary | ICD-10-CM | POA: Diagnosis not present

## 2023-09-20 DIAGNOSIS — K5900 Constipation, unspecified: Secondary | ICD-10-CM | POA: Diagnosis present

## 2023-09-20 DIAGNOSIS — Z79899 Other long term (current) drug therapy: Secondary | ICD-10-CM | POA: Diagnosis not present

## 2023-09-20 DIAGNOSIS — Z8049 Family history of malignant neoplasm of other genital organs: Secondary | ICD-10-CM | POA: Diagnosis not present

## 2023-09-20 DIAGNOSIS — K2970 Gastritis, unspecified, without bleeding: Secondary | ICD-10-CM | POA: Diagnosis present

## 2023-09-20 LAB — CBC
HCT: 37.5 % (ref 36.0–46.0)
Hemoglobin: 12.9 g/dL (ref 12.0–15.0)
MCH: 29.1 pg (ref 26.0–34.0)
MCHC: 34.4 g/dL (ref 30.0–36.0)
MCV: 84.5 fL (ref 80.0–100.0)
Platelets: 336 10*3/uL (ref 150–400)
RBC: 4.44 MIL/uL (ref 3.87–5.11)
RDW: 12.5 % (ref 11.5–15.5)
WBC: 12.6 10*3/uL — ABNORMAL HIGH (ref 4.0–10.5)
nRBC: 0 % (ref 0.0–0.2)

## 2023-09-20 LAB — BASIC METABOLIC PANEL WITH GFR
Anion gap: 5 (ref 5–15)
BUN: 11 mg/dL (ref 6–20)
CO2: 25 mmol/L (ref 22–32)
Calcium: 8.3 mg/dL — ABNORMAL LOW (ref 8.9–10.3)
Chloride: 100 mmol/L (ref 98–111)
Creatinine, Ser: 0.76 mg/dL (ref 0.44–1.00)
GFR, Estimated: >60 mL/min (ref >60)
Glucose, Bld: 86 mg/dL (ref 70–99)
Potassium: 3.3 mmol/L — ABNORMAL LOW (ref 3.5–5.1)
Sodium: 130 mmol/L — ABNORMAL LOW (ref 135–145)

## 2023-09-20 LAB — HEPATIC FUNCTION PANEL
ALT: 68 U/L — ABNORMAL HIGH (ref 0–44)
AST: 39 U/L (ref 15–41)
Albumin: 3.6 g/dL (ref 3.5–5.0)
Alkaline Phosphatase: 50 U/L (ref 38–126)
Bilirubin, Direct: 0.2 mg/dL (ref 0.0–0.2)
Indirect Bilirubin: 0.8 mg/dL (ref 0.3–0.9)
Total Bilirubin: 1 mg/dL (ref 0.0–1.2)
Total Protein: 6.8 g/dL (ref 6.5–8.1)

## 2023-09-20 LAB — D-DIMER, QUANTITATIVE: D-Dimer, Quant: 0.49 ug{FEU}/mL (ref 0.00–0.50)

## 2023-09-20 LAB — MAGNESIUM: Magnesium: 2.2 mg/dL (ref 1.7–2.4)

## 2023-09-20 LAB — HIV ANTIBODY (ROUTINE TESTING W REFLEX): HIV Screen 4th Generation wRfx: NONREACTIVE

## 2023-09-20 MED ORDER — NORETHIN-ETH ESTRAD TRIPHASIC 0.5/0.75/1-35 MG-MCG PO TABS
1.0000 | ORAL_TABLET | Freq: Every day | ORAL | Status: DC
  Administered 2023-09-22: 1 via ORAL
  Filled 2023-09-20 (×2): qty 1

## 2023-09-20 MED ORDER — HYDROCODONE-ACETAMINOPHEN 5-325 MG PO TABS: 1.0000 | ORAL_TABLET | ORAL | Status: DC | PRN

## 2023-09-20 MED ORDER — ACETAMINOPHEN 325 MG PO TABS: 650.0000 mg | ORAL_TABLET | Freq: Four times a day (QID) | ORAL | Status: DC | PRN

## 2023-09-20 MED ORDER — NORTRIPTYLINE HCL 10 MG PO CAPS
20.0000 mg | ORAL_CAPSULE | Freq: Every day | ORAL | Status: DC
Start: 2023-09-20 — End: 2023-09-22
  Administered 2023-09-20 – 2023-09-21 (×2): 30 mg via ORAL
  Filled 2023-09-20 (×2): qty 3

## 2023-09-20 MED ORDER — PANTOPRAZOLE SODIUM 40 MG IV SOLR
40.0000 mg | Freq: Two times a day (BID) | INTRAVENOUS | Status: DC
  Administered 2023-09-20 – 2023-09-22 (×5): 40 mg via INTRAVENOUS
  Filled 2023-09-20 (×5): qty 10

## 2023-09-20 MED ORDER — PANTOPRAZOLE SODIUM 40 MG PO TBEC
40.0000 mg | DELAYED_RELEASE_TABLET | Freq: Two times a day (BID) | ORAL | 0 refills | Status: AC
Start: 2023-09-20 — End: 2024-09-19

## 2023-09-20 MED ORDER — LEVOTHYROXINE SODIUM 50 MCG PO TABS
75.0000 ug | ORAL_TABLET | Freq: Every day | ORAL | Status: DC
Start: 2023-09-21 — End: 2023-09-22
  Administered 2023-09-21: 75 ug via ORAL
  Filled 2023-09-20: qty 2

## 2023-09-20 MED ORDER — TECHNETIUM TC 99M MEBROFENIN IV KIT
5.0000 | PACK | Freq: Once | INTRAVENOUS | Status: AC | PRN
  Administered 2023-09-20: 5.23 via INTRAVENOUS

## 2023-09-20 MED ORDER — ONDANSETRON 4 MG PO TBDP
4.0000 mg | ORAL_TABLET | Freq: Four times a day (QID) | ORAL | 0 refills | Status: AC | PRN
Start: 2023-09-20 — End: ?

## 2023-09-20 MED ORDER — TRAMADOL HCL 50 MG PO TABS
50.0000 mg | ORAL_TABLET | Freq: Four times a day (QID) | ORAL | Status: DC | PRN
  Filled 2023-09-20: qty 1

## 2023-09-20 MED ORDER — BUPROPION HCL ER (XL) 150 MG PO TB24
150.0000 mg | ORAL_TABLET | Freq: Every day | ORAL | Status: DC
  Administered 2023-09-20 – 2023-09-22 (×3): 150 mg via ORAL
  Filled 2023-09-20 (×3): qty 1

## 2023-09-20 MED ORDER — PANTOPRAZOLE SODIUM 20 MG PO TBEC: 20.0000 mg | DELAYED_RELEASE_TABLET | Freq: Every day | ORAL | Status: DC

## 2023-09-20 MED ORDER — POTASSIUM CHLORIDE CRYS ER 20 MEQ PO TBCR
40.0000 meq | EXTENDED_RELEASE_TABLET | Freq: Once | ORAL | Status: AC
  Administered 2023-09-20: 40 meq via ORAL
  Filled 2023-09-20: qty 2

## 2023-09-20 NOTE — Progress Notes (Addendum)
 Subjective:  CC: Diane Moss is a 28 y.o. female  Hospital stay day 0,   abdominal pain and intractable nausea  HPI: No acute issues overnight.  Still having nausea issues and had 1 episode of emesis again this morning during the HIDA scan.  Patient states pain still present.  Had a good bowel movement last night.  ROS:  Pertinent positives and negatives noted in the HPI.    Objective:   Temp:  [97.6 F (36.4 C)-98.6 F (37 C)] 97.6 F (36.4 C) (04/29 0519) Pulse Rate:  [54-68] 60 (04/29 0608) Resp:  [18-20] 20 (04/29 0519) BP: (136-165)/(90-104) 158/99 (04/29 0608) SpO2:  [100 %] 100 % (04/29 0519) Weight:  [213 kg] 108 kg (04/28 1851)     Height: 5\' 6"  (167.6 cm) Weight: 108 kg BMI (Calculated): 38.45   Intake/Output this shift:   Intake/Output Summary (Last 24 hours) at 09/20/2023 1341 Last data filed at 09/20/2023 0865 Gross per 24 hour  Intake 502.15 ml  Output --  Net 502.15 ml    Constitutional :  alert, cooperative, appears stated age, and no distress  Respiratory:  Clear to auscultation bilaterally  Cardiovascular:  Regular rate and rhythm  Gastrointestinal: Soft, no guarding, tenderness to palpation periumbilical today .   Skin: Cool and moist.   Psychiatric: Normal affect, non-agitated, not confused       LABS:     Latest Ref Rng & Units 09/20/2023    5:41 AM 09/19/2023    5:37 PM 09/16/2023   10:24 PM  CMP  Glucose 70 - 99 mg/dL 86  90  784   BUN 6 - 20 mg/dL 11  13  12    Creatinine 0.44 - 1.00 mg/dL 6.96  2.95  2.84   Sodium 135 - 145 mmol/L 130  133  136   Potassium 3.5 - 5.1 mmol/L 3.3  3.4  3.4   Chloride 98 - 111 mmol/L 100  99  103   CO2 22 - 32 mmol/L 25  24  22    Calcium 8.9 - 10.3 mg/dL 8.3  9.2  9.3   Total Protein 6.5 - 8.1 g/dL 6.8  7.2  7.5   Total Bilirubin 0.0 - 1.2 mg/dL 1.0  0.8  0.9   Alkaline Phos 38 - 126 U/L 50  55  57   AST 15 - 41 U/L 39  48  35   ALT 0 - 44 U/L 68  72  32       Latest Ref Rng & Units 09/20/2023    5:41  AM 09/19/2023    5:37 PM 09/16/2023   10:24 PM  CBC  WBC 4.0 - 10.5 K/uL 12.6  11.9  9.8   Hemoglobin 12.0 - 15.0 g/dL 13.2  44.0  10.2   Hematocrit 36.0 - 46.0 % 37.5  39.1  38.0   Platelets 150 - 400 K/uL 336  341  409     RADS: CLINICAL DATA:  RIGHT upper quadrant pain. Cholelithiasis on cross-sectional imaging.   EXAM: NUCLEAR MEDICINE HEPATOBILIARY IMAGING WITH GALLBLADDER EF   TECHNIQUE: Sequential images of the abdomen were obtained out to 60 minutes following intravenous administration of radiopharmaceutical. After oral ingestion of Ensure, gallbladder ejection fraction was determined. At 60 min, normal ejection fraction is greater than 33%.   RADIOPHARMACEUTICALS:  5.0 mCi Tc-13m  Choletec IV   COMPARISON:  Ultrasound and CT 09/16/2018   FINDINGS: Prompt clearance radiotracer from blood pool and homogeneous uptake in liver. Counts  are evident in the common bile duct and small bowel by 30 minutes. The gallbladder begins to fill at 60 minutes. At 85 minutes gallbladder is more distinctive.   Liquid ensure was administered to stimulate contracted gallbladder. Patient vomited shortly after drinking Ensure. Gallbladder ejection fraction cannot be calculated   IMPRESSION: 1. Patent cystic duct and common bile duct. No evidence acute cholecystitis. 2. Gallbladder ejection fraction cannot be calculated as patient vomited the fatty meal.     Electronically Signed   By: Deboraha Fallow M.D.   On: 09/20/2023 12:02   Assessment:   Abdominal pain and intractable nausea and vomiting.  HIDA scan negative as above.  EF noted to be greater than 33% at the 60-minute mark, although final calculations could not be completed due to the vomiting episode.  Despite the leukocytosis now and very slightly elevated LFTs, her history and clinical exam still was not consistent with gallbladder pathology.  Prior to the HIDA scan, we briefly discussed proceeding with surgery or  continuing with HIDA scan.  Patient requested continuing with the HIDA scan.  Results noted as above now.  With no obvious gallbladder pathology, no surgical intervention is recommended at this time.  Further pathology such as gastric pathology may be the cause of her persistent symptoms.  Recommended to patient that we have hospitalist take over management for further workup.  Due to the intractable vomiting and inability to tolerate p.o., family requesting that we continue workup in house where she can get IV fluid resuscitation.  She otherwise does not look in acute distress, and electrolytes remain overall within normal limits  labs/images/medications/previous chart entries reviewed personally and relevant changes/updates noted above.

## 2023-09-20 NOTE — Assessment & Plan Note (Addendum)
 Recurrent nausea and vomiting over multiple days Status post recent full surgical workup for biliary etiology with normal HIDA scan T. bili and LFTs grossly stable Noted epigastric tenderness and exam concerning for gastritis - no noted recent NSAID/Etoh  use Suspect primary etiology secondary to recent GLP-1 use given that symptoms started within 4 to 5 hours of increased dose implementation Will place patient on course of IV PPI Dr. Rosea Conch to reach out to gastroenterology for any further recommendations Given nonspecific chest pain in the setting of OCP use, I will also check a D-dimer to rule out PE as an atypical etiology Will otherwise monitor and sign off for now Plan of care discussed with Dr. Rosea Conch who was in agreement Follow closely

## 2023-09-20 NOTE — Consult Note (Addendum)
 PHARMACY CONSULT NOTE - FOLLOW UP  Pharmacy Consult for Electrolyte Monitoring and Replacement   Recent Labs: Potassium (mmol/L)  Date Value  09/20/2023 3.3 (L)   Magnesium (mg/dL)  Date Value  86/57/8469 2.1   Calcium (mg/dL)  Date Value  62/95/2841 8.3 (L)   Albumin (g/dL)  Date Value  32/44/0102 3.6   Sodium (mmol/L)  Date Value  09/20/2023 130 (L)   Assessment: Diane Moss is a 28 y.o. female with past medical history of obesity, anxiety who is currently admitted on the surgical service for abdominal pain and current nausea and vomiting. Patient reports nausea and vomiting over the past 4 to 5 days. They've recently started a course of Wegovy. Took an increased dose last week with nausea and vomiting starting roughly within 4 to 5 hours after the increased dose. Pharmacy has been consulted to manage this patient's electrolytes.   Goal of Therapy:  Electrolytes WNL  Plan:  K = 3.3; Replaced with 40 mEq of PO Kcl x 1 Patient has tolerated some PO medications, can change to IV if not tolerated Mg 2.2; no replacement indicated at this time Phos order tomorrow Continue to follow electrolytes with AM labs  Thank you for allowing pharmacy to participate in this patient's care.  Craven Do, PharmD Pharmacy Resident  09/20/2023 6:20 PM

## 2023-09-20 NOTE — Assessment & Plan Note (Signed)
 Potassium 3.3 Replete per protocol Monitor

## 2023-09-20 NOTE — Discharge Instructions (Signed)
 Followup with primary for additional concerns or persistent symptoms.

## 2023-09-20 NOTE — Consult Note (Signed)
 Initial Consultation Note   Patient: Diane Moss XLK:[id number] DOB: [birth year] PCP: Rex Castor, MD DOA: 09/19/2023 DOS: the patient was seen and examined on 09/20/2023 Primary service: Conrado Delay, DO  Referring physician: Rosea Conch  Reason for consult: Recurrent nausea vomiting  Assessment/Plan: Assessment and Plan: Nausea and vomiting Recurrent nausea and vomiting over multiple days Status post recent full surgical workup for biliary etiology with normal HIDA scan T. bili and LFTs grossly stable Noted epigastric tenderness and exam concerning for gastritis - no noted recent NSAID/Etoh  use Suspect primary etiology secondary to recent GLP-1 use given that symptoms started within 4 to 5 hours of increased dose implementation Will place patient on course of IV PPI Dr. Rosea Conch to reach out to gastroenterology for any further recommendations Given nonspecific chest pain in the setting of OCP use, I will also check a D-dimer to rule out PE as an atypical etiology Will otherwise monitor and sign off for now Plan of care discussed with Dr. Rosea Conch who was in agreement Follow closely  Contraception management On Cyclafem chronically Will check D-dimer x 1 to rule out PE as atypical source of epigastric pain Monitor       TRH will sign off at present, please call us  again when needed.  HPI: Diane Moss is a 28 y.o. female with past medical history of obesity, anxiety who is currently admitted on the surgical service for abdominal pain and current nausea and vomiting.  Also requested were made by Dr. Rosea Conch for nausea and and vomiting.  Patient reports nausea and vomiting over the past 4 to 5 days.  Also with significant abdominal pain.  Patient states that she has recently started a course of Wegovy.  Took an increased dose last week with nausea and vomiting starting roughly within 4 to 5 hours of increased dose.  Also with abdominal pain.  Pain seems to be more predominant in the  epigastric region.  Has had some episodes of vomiting.  Nonbilious nonbloody.  No reported diarrhea.  No chest pain or shortness of breath.  No focal hemiparesis or confusion.  Has had some epigastric as well as reflux-like symptoms.  Denies any recent NSAID use.  Denies any known history of GERD in the past.  No reported alcohol use.  No reported illicit drug use.  Has had fairly complete surgical evaluation including HIDA scan that was negative for cholecystitis.  Had abdominal ultrasound which showed cholelithiasis and gallbladder sludge with some?  Biliary colic. Currently afebrile, hemodynamically stable.  Satting well on room air.  White count 12.6, hemoglobin 12.9, platelets 336.  AST 39, ALT 68, T. bili 1.  Creatinine 0.76.  Potassium 3.3.  Sodium 130.  Review of Systems: As mentioned in the history of present illness. All other systems reviewed and are negative. Past Medical History:  Diagnosis Date   Acne    Anxiety    Headache(784.0)    Obesity    Past Surgical History:  Procedure Laterality Date   EXTRACORPOREAL SHOCK WAVE LITHOTRIPSY Left 08/17/2018   Procedure: EXTRACORPOREAL SHOCK WAVE LITHOTRIPSY (ESWL);  Surgeon: Dustin Gimenez, MD;  Location: [facility] ORS;  Service: Urology;  Laterality: Left;   Social History:  reports that she has never smoked. She has never used smokeless tobacco. She reports that she does not drink alcohol and does not use drugs.  Allergies  Allergen Reactions   Peach Flavoring Agent (Non-Screening) Swelling and Rash    Family History  Problem Relation Age of Onset  Diabetes Other    Hyperlipidemia Other    Hypertension Other    Cancer Mother 2       cervial and ovarian    Prior to Admission medications   Medication Sig Start Date End Date Taking? Authorizing Provider  buPROPion (WELLBUTRIN XL) 150 MG 24 hr tablet Take 150 mg by mouth daily.   Yes [provider]  escitalopram (LEXAPRO) 20 MG tablet Take 20 mg by mouth daily.   Yes  [provider]  levothyroxine (SYNTHROID) 75 MCG tablet Take 75 mcg by mouth daily before breakfast.   Yes [provider]  Magnesium Oxide 250 MG TABS Take 250 mg by mouth 2 (two) times daily.   Yes [provider]  NORTREL  7/7/7 0.5/0.75/1-35 MG-MCG tablet TAKE 1 TABLET BY MOUTH EVERY DAY 09/22/20  Yes Cirigliano, Mary K, DO  nortriptyline (PAMELOR) 10 MG capsule Take 20-30 mg by mouth at bedtime.   Yes [provider]  pantoprazole  (PROTONIX ) 40 MG tablet Take 1 tablet (40 mg total) by mouth 2 (two) times daily. 09/20/23 09/19/24 Yes Sakai, Isami, DO  Semaglutide-Weight Management (WEGOVY) 1.7 MG/0.75ML SOAJ Inject 1.7 mg into the skin once a week.   Yes [provider]  ondansetron  (ZOFRAN -ODT) 4 MG disintegrating tablet Take 1 tablet (4 mg total) by mouth 2 (two) times daily as needed for up to 6 doses for nausea or vomiting. 09/16/23   Buell Carmin, MD  ondansetron  (ZOFRAN -ODT) 4 MG disintegrating tablet Take 1 tablet (4 mg total) by mouth every 6 (six) hours as needed for nausea. 09/20/23   Sakai, Isami, DO  promethazine  (PHENERGAN ) 12.5 MG tablet Take 1 tablet (12.5 mg total) by mouth every 6 (six) hours as needed for nausea or vomiting. 09/17/23   Iver Marker, MD    Physical Exam: Vitals:   09/19/23 2032 09/20/23 0519 09/20/23 0608 09/20/23 1534  BP: (!) 136/91 (!) 155/104 (!) 158/99 (!) 155/105  Pulse: (!) 54 68 60 78  Resp: 20 20  16   Temp: 98.4 F (36.9 C) 97.6 F (36.4 C)  99.7 F (37.6 C)  TempSrc: Oral Oral    SpO2: 100% 100%  100%  Weight:      Height:       Physical Exam Constitutional:      Appearance: She is obese.  HENT:     Head: Normocephalic and atraumatic.     Nose: Nose normal.     Mouth/Throat:     Mouth: Mucous membranes are moist.  Eyes:     Pupils: Pupils are equal, round, and reactive to light.  Cardiovascular:     Rate and Rhythm: Normal rate and regular rhythm.  Pulmonary:     Effort: Pulmonary effort is  normal.  Abdominal:     General: Bowel sounds are normal.  Musculoskeletal:        General: Normal range of motion.  Skin:    General: Skin is warm.  Neurological:     General: No focal deficit present.  Psychiatric:        Mood and Affect: Mood normal.     Data Reviewed:   There are no new results to review at this time.    Family Communication: Plan of care discussed with patient and mother who was on the phone  Primary team communication: Plan of care discussed w/ Dr. Rosea Conch.  Thank you very much for involving us  in the care of your patient.  Author: Corrinne Din, MD 09/20/2023 5:59 PM  For on call review [email or web address].

## 2023-09-20 NOTE — Assessment & Plan Note (Signed)
 On Cyclafem chronically Will check D-dimer x 1 to rule out PE as atypical source of epigastric pain Monitor

## 2023-09-21 DIAGNOSIS — R112 Nausea with vomiting, unspecified: Secondary | ICD-10-CM | POA: Diagnosis not present

## 2023-09-21 DIAGNOSIS — K810 Acute cholecystitis: Secondary | ICD-10-CM | POA: Diagnosis not present

## 2023-09-21 LAB — BASIC METABOLIC PANEL WITH GFR
Anion gap: 8 (ref 5–15)
Anion gap: 4 — ABNORMAL LOW (ref 5–15)
BUN: 9 mg/dL (ref 6–20)
BUN: 9 mg/dL (ref 6–20)
CO2: 22 mmol/L (ref 22–32)
CO2: 24 mmol/L (ref 22–32)
Calcium: 8.7 mg/dL — ABNORMAL LOW (ref 8.9–10.3)
Calcium: 8.9 mg/dL (ref 8.9–10.3)
Chloride: 105 mmol/L (ref 98–111)
Chloride: 106 mmol/L (ref 98–111)
Creatinine, Ser: 0.7 mg/dL (ref 0.44–1.00)
Creatinine, Ser: 1.01 mg/dL — ABNORMAL HIGH (ref 0.44–1.00)
GFR, Estimated: >60 mL/min (ref >60)
GFR, Estimated: >60 mL/min (ref >60)
Glucose, Bld: 86 mg/dL (ref 70–99)
Glucose, Bld: 119 mg/dL — ABNORMAL HIGH (ref 70–99)
Potassium: 3.5 mmol/L (ref 3.5–5.1)
Potassium: 3.7 mmol/L (ref 3.5–5.1)
Sodium: 135 mmol/L (ref 135–145)
Sodium: 134 mmol/L — ABNORMAL LOW (ref 135–145)

## 2023-09-21 LAB — CBC
HCT: 37.9 % (ref 36.0–46.0)
Hemoglobin: 13.1 g/dL (ref 12.0–15.0)
MCH: 29.2 pg (ref 26.0–34.0)
MCHC: 34.6 g/dL (ref 30.0–36.0)
MCV: 84.6 fL (ref 80.0–100.0)
Platelets: 309 10*3/uL (ref 150–400)
RBC: 4.48 MIL/uL (ref 3.87–5.11)
RDW: 12.6 % (ref 11.5–15.5)
WBC: 6.4 10*3/uL (ref 4.0–10.5)
nRBC: 0 % (ref 0.0–0.2)

## 2023-09-21 LAB — HEPATIC FUNCTION PANEL
ALT: 65 U/L — ABNORMAL HIGH (ref 0–44)
AST: 31 U/L (ref 15–41)
Albumin: 3.3 g/dL — ABNORMAL LOW (ref 3.5–5.0)
Alkaline Phosphatase: 49 U/L (ref 38–126)
Bilirubin, Direct: 0.1 mg/dL (ref 0.0–0.2)
Indirect Bilirubin: 0.5 mg/dL (ref 0.3–0.9)
Total Bilirubin: 0.6 mg/dL (ref 0.0–1.2)
Total Protein: 6.2 g/dL — ABNORMAL LOW (ref 6.5–8.1)

## 2023-09-21 LAB — PHOSPHORUS: Phosphorus: 2.9 mg/dL (ref 2.5–4.6)

## 2023-09-21 LAB — MAGNESIUM: Magnesium: 2.2 mg/dL (ref 1.7–2.4)

## 2023-09-21 MED ORDER — SODIUM CHLORIDE 0.9 % IV SOLN
12.5000 mg | Freq: Four times a day (QID) | INTRAVENOUS | Status: DC | PRN
  Administered 2023-09-21: 12.5 mg via INTRAVENOUS
  Filled 2023-09-21: qty 12.5

## 2023-09-21 MED ORDER — ALUM & MAG HYDROXIDE-SIMETH 200-200-20 MG/5ML PO SUSP: 30.0000 mL | Freq: Four times a day (QID) | ORAL | Status: DC | PRN

## 2023-09-21 MED ORDER — LIDOCAINE VISCOUS HCL 2 % MT SOLN: 15.0000 mL | Freq: Four times a day (QID) | OROMUCOSAL | Status: DC | PRN

## 2023-09-21 MED ORDER — METOCLOPRAMIDE HCL 5 MG/ML IJ SOLN
5.0000 mg | Freq: Three times a day (TID) | INTRAMUSCULAR | Status: DC
  Administered 2023-09-21 – 2023-09-22 (×5): 5 mg via INTRAVENOUS
  Filled 2023-09-21 (×5): qty 2

## 2023-09-21 MED ORDER — METOCLOPRAMIDE HCL 5 MG PO TABS: 5.0000 mg | ORAL_TABLET | Freq: Three times a day (TID) | ORAL | 0 refills | Status: AC | PRN

## 2023-09-21 NOTE — Consult Note (Signed)
 PHARMACY CONSULT NOTE - FOLLOW UP  Pharmacy Consult for Electrolyte Monitoring and Replacement   Recent Labs: Potassium (mmol/L)  Date Value  09/21/2023 3.5   Magnesium (mg/dL)  Date Value  09/81/1914 2.2   Calcium (mg/dL)  Date Value  78/29/5621 8.7 (L)   Albumin (g/dL)  Date Value  30/86/5784 3.3 (L)   Phosphorus (mg/dL)  Date Value  69/62/9528 2.9   Sodium (mmol/L)  Date Value  09/21/2023 135   Assessment: Diane Moss is a 28 y.o. female with past medical history of obesity, anxiety who is currently admitted on the surgical service for abdominal pain and current nausea and vomiting. Patient reports nausea and vomiting over the past 4 to 5 days. They've recently started a course of Wegovy. Took an increased dose last week with nausea and vomiting starting roughly within 4 to 5 hours after the increased dose. Pharmacy has been consulted to manage this patient's electrolytes.   Goal of Therapy:  Electrolytes WNL  Plan:  No replacement indicated at this time Continue to follow electrolytes with AM labs  Thank you for allowing pharmacy to participate in this patient's care.  Alice Innocent, PharmD Clinical Pharmacist  09/21/2023 7:37 AM

## 2023-09-21 NOTE — Consult Note (Signed)
 Marnee Sink, MD [facility]  [address] [place], [place] [address] Phone: [phone] Fax : [phone]  Consultation  Referring Provider:     Conrado Delay, DO Primary Care Physician:  Rex Castor, MD Primary Gastroenterologist: Para Bold         Reason for Consultation:     Nausea and vomiting  Date of Admission:  09/19/2023 Date of Consultation:  09/21/2023         HPI:   Diane PASZEK is a 28 y.o. female who was admitted with the thought of possible gallbladder attack with substernal discomfort and nausea with vomiting.  The patient had increased her dose of Wegovy last Tuesday which was 8 days ago and reports that her symptoms started immediately after that which also included constipation.  The patient had been continuing to have the symptoms except that she states that her vomiting has stopped.  She was able to consume a breakfast including eggs and bacon this morning.  She states that she does feel the chest pressure epigastric discomfort and nausea despite the vomiting to have subsided.  It was reported that the patient had refused a GI workup at the start but now is willing to entertain being seen by GI therefore GI consult was called.  The patient denies any hematemesis black stools or bloody stools.  She also denies any unexplained weight loss.  Past Medical History:  Diagnosis Date   Acne    Anxiety    Headache(784.0)    Obesity     Past Surgical History:  Procedure Laterality Date   EXTRACORPOREAL SHOCK WAVE LITHOTRIPSY Left 08/17/2018   Procedure: EXTRACORPOREAL SHOCK WAVE LITHOTRIPSY (ESWL);  Surgeon: Dustin Gimenez, MD;  Location: [facility] ORS;  Service: Urology;  Laterality: Left;    Prior to Admission medications   Medication Sig Start Date End Date Taking? Authorizing Provider  buPROPion (WELLBUTRIN XL) 150 MG 24 hr tablet Take 150 mg by mouth daily.   Yes [provider]  escitalopram (LEXAPRO) 20 MG tablet Take 20 mg by mouth daily.    Yes [provider]  levothyroxine (SYNTHROID) 75 MCG tablet Take 75 mcg by mouth daily before breakfast.   Yes [provider]  Magnesium Oxide 250 MG TABS Take 250 mg by mouth 2 (two) times daily.   Yes [provider]  metoCLOPramide (REGLAN) 5 MG tablet Take 1 tablet (5 mg total) by mouth every 8 (eight) hours as needed for nausea or vomiting. 09/21/23 09/20/24 Yes Sakai, Isami, DO  NORTREL  7/7/7 0.5/0.75/1-35 MG-MCG tablet TAKE 1 TABLET BY MOUTH EVERY DAY 09/22/20  Yes Cirigliano, Mary K, DO  nortriptyline (PAMELOR) 10 MG capsule Take 20-30 mg by mouth at bedtime.   Yes [provider]  pantoprazole  (PROTONIX ) 40 MG tablet Take 1 tablet (40 mg total) by mouth 2 (two) times daily. 09/20/23 09/19/24 Yes Sakai, Isami, DO  Semaglutide-Weight Management (WEGOVY) 1.7 MG/0.75ML SOAJ Inject 1.7 mg into the skin once a week.   Yes [provider]  ondansetron  (ZOFRAN -ODT) 4 MG disintegrating tablet Take 1 tablet (4 mg total) by mouth 2 (two) times daily as needed for up to 6 doses for nausea or vomiting. 09/16/23   Buell Carmin, MD  ondansetron  (ZOFRAN -ODT) 4 MG disintegrating tablet Take 1 tablet (4 mg total) by mouth every 6 (six) hours as needed for nausea. 09/20/23   Rosea Conch, Isami, DO  promethazine  (PHENERGAN ) 12.5 MG tablet Take 1 tablet (12.5 mg total) by mouth every 6 (  six) hours as needed for nausea or vomiting. 09/17/23   Iver Marker, MD    Family History  Problem Relation Age of Onset   Diabetes Other    Hyperlipidemia Other    Hypertension Other    Cancer Mother 71       cervial and ovarian     Social History   Tobacco Use   Smoking status: Never   Smokeless tobacco: Never  Substance Use Topics   Alcohol use: No    Alcohol/week: 0.0 standard drinks of alcohol   Drug use: No    Allergies as of 09/19/2023 - Review Complete 09/19/2023  Allergen Reaction Noted   Peach flavoring agent (non-screening) Swelling and Rash 10/23/2013    Review of  Systems:    All systems reviewed and negative except where noted in HPI.   Physical Exam:  Vital signs in last 24 hours: Temp:  [98 F (36.7 C)-99.7 F (37.6 C)] 98.2 F (36.8 C) (04/30 0828) Pulse Rate:  [74-78] 74 (04/30 0828) Resp:  [15-18] 15 (04/30 0828) BP: (122-155)/(87-105) 122/87 (04/30 0828) SpO2:  [99 %-100 %] 99 % (04/30 0828) Last BM Date : 09/20/23 General:   Pleasant, cooperative in NAD Head:  Normocephalic and atraumatic. Eyes:   No icterus.   Conjunctiva pink. PERRLA. Ears:  Normal auditory acuity. Neck:  Supple; no masses or thyroidomegaly Lungs: Respirations even and unlabored. Lungs clear to auscultation bilaterally.   No wheezes, crackles, or rhonchi.  Heart:  Regular rate and rhythm;  Without murmur, clicks, rubs or gallops Abdomen:  Soft, nondistended, mild epigastric tenderness. Normal bowel sounds. No appreciable masses or hepatomegaly.  No rebound or guarding.  Rectal:  Not performed. Msk:  Symmetrical without gross deformities.    Extremities:  Without edema, cyanosis or clubbing. Neurologic:  Alert and oriented x3;  grossly normal neurologically. Skin:  Intact without significant lesions or rashes. Cervical Nodes:  No significant cervical adenopathy. Psych:  Alert and cooperative. Normal affect.  LAB RESULTS: Recent Labs    09/19/23 1737 09/20/23 0541 09/21/23 0609  WBC 11.9* 12.6* 6.4  HGB 13.2 12.9 13.1  HCT 39.1 37.5 37.9  PLT 341 336 309   BMET Recent Labs    09/20/23 0541 09/21/23 0609 09/21/23 1014  NA 130* 135 134*  K 3.3* 3.5 3.7  CL 100 105 106  CO2 25 22 24   GLUCOSE 86 86 119*  BUN 11 9 9   CREATININE 0.76 0.70 1.01*  CALCIUM 8.3* 8.7* 8.9   LFT Recent Labs    09/21/23 0609  PROT 6.2*  ALBUMIN 3.3*  AST 31  ALT 65*  ALKPHOS 49  BILITOT 0.6  BILIDIR 0.1  IBILI 0.5   PT/INR No results for input(s): "LABPROT", "INR" in the last 72 hours.  STUDIES: NM Hepato W/EF Result Date: 09/20/2023 CLINICAL DATA:  RIGHT  upper quadrant pain. Cholelithiasis on cross-sectional imaging. EXAM: NUCLEAR MEDICINE HEPATOBILIARY IMAGING WITH GALLBLADDER EF TECHNIQUE: Sequential images of the abdomen were obtained out to 60 minutes following intravenous administration of radiopharmaceutical. After oral ingestion of Ensure, gallbladder ejection fraction was determined. At 60 min, normal ejection fraction is greater than 33%. RADIOPHARMACEUTICALS:  5.0 mCi Tc-41m  Choletec IV COMPARISON:  Ultrasound and CT 09/16/2018 FINDINGS: Prompt clearance radiotracer from blood pool and homogeneous uptake in liver. Counts are evident in the common bile duct and small bowel by 30 minutes. The gallbladder begins to fill at 60 minutes. At 85 minutes gallbladder is more distinctive. Liquid ensure was administered to stimulate  contracted gallbladder. Patient vomited shortly after drinking Ensure. Gallbladder ejection fraction cannot be calculated IMPRESSION: 1. Patent cystic duct and common bile duct. No evidence acute cholecystitis. 2. Gallbladder ejection fraction cannot be calculated as patient vomited the fatty meal. Electronically Signed   By: Deboraha Fallow M.D.   On: 09/20/2023 12:02      Impression / Plan:   Assessment: Principal Problem:   Acute cholecystitis Active Problems:   Contraception management   Cholecystitis   Nausea and vomiting   Hypokalemia   MONYAE Moss is a 28 y.o. y/o female with persistent nausea with vomiting that resolved yesterday.  The patient had increased her dose of Wegovy 8 days ago when her symptoms started.  She had a negative HIDA scan.  The patient was evaluated by surgery for possible gallbladder disease and this has been essentially ruled out.  The patient denies ever having symptoms like this in the past.  Plan:  The patient will be set up for an upper endoscopy for tomorrow.  Should be kept n.p.o. after midnight.  The patient has been explained that there are many different causes for nausea and  vomiting some of which are not GI related but have a central cause.  Having said that the patient will be set up for an upper endoscopy to rule out any GI pathology that may be causing the symptoms.  If it is negative then I suggest the patient going back on the lower dose of Wegovy or switching to a different GLP-1.  The patient has been explained the plan and agrees with it.  Thank you for involving me in the care of this patient.      LOS: 1 day   Marnee Sink, MD, MD. Sylvan Evener 09/21/2023, 12:53 PM,  Pager [phone] 7am-5pm  Check AMION for 5pm -7am coverage and on weekends   Note: This dictation was prepared with Dragon dictation along with smaller phrase technology. Any transcriptional errors that result from this process are unintentional.

## 2023-09-21 NOTE — Progress Notes (Addendum)
 Progress Note   Patient: Diane Moss NGE:[id number] DOB: 1996/02/21 DOA: 09/19/2023     1 DOS: the patient was seen and examined on 09/21/2023   Brief hospital course: HPI from 4/29: "Diane Moss is a 28 y.o. female with past medical history of obesity, anxiety who is currently admitted on the surgical service for abdominal pain and current nausea and vomiting.  Also requested were made by Dr. Rosea Conch for nausea and and vomiting.  Patient reports nausea and vomiting over the past 4 to 5 days.  Also with significant abdominal pain.  Patient states that she has recently started a course of Wegovy.  Took an increased dose last week with nausea and vomiting starting roughly within 4 to 5 hours of increased dose.  Also with abdominal pain.  Pain seems to be more predominant in the epigastric region.  Has had some episodes of vomiting.  Nonbilious nonbloody.  No reported diarrhea.  No chest pain or shortness of breath.  No focal hemiparesis or confusion.  Has had some epigastric as well as reflux-like symptoms.  Denies any recent NSAID use.  Denies any known history of GERD in the past.  No reported alcohol use.  No reported illicit drug use.  Has had fairly complete surgical evaluation including HIDA scan that was negative for cholecystitis.  Had abdominal ultrasound which showed cholelithiasis and gallbladder sludge with some?  Biliary colic. Currently afebrile, hemodynamically stable.  Satting well on room air.  White count 12.6, hemoglobin 12.9, platelets 336.  AST 39, ALT 68, T. bili 1.  Creatinine 0.76.  Potassium 3.3.  Sodium 130."  Patient was initially admitted to General Surgery's service and medicine was consulted.  After cholecystitis was ruled out including negative HIDA scan, Surgery requested Hospitalist service assume care as primary.  GI is now consulted and plans for EGD.  I assumed care 09/21/23.  Patient having ongoing nausea/vomiting.     Assessment and Plan:  Nausea and vomiting  - intractable over ~ 1 week prior to admission Status post recent full surgical workup for biliary etiology with normal HIDA scan T. bili and LFTs are stable Suspect gastritis vs viral illness vs increased GLP-1 dose given timing of symptom onset correlating with the day dose was increased. --GI now consulted, plan for EGD tomorrow --NPO after midnight --IV PPI --IV fluids --Monitor & replace electrolytes   Atypical chest pain - suspect esophageal irritation from N/V, unlikely cardiac.  D-dimer was obtained and negative, rules out PE. Pain is non-exertional.  No family hx of cardiac disease in young. Given nonspecific chest pain in the setting of OCP use, I will also  Troponin was normal. --Trial GI cocktail / supportive care   Hypokalemia - replaced & resolved. --Monitor BMP, replace K as needed  Contraception management --On Cyclafem chronically      Subjective: Pt awake sitting up in bed when seen this AM. She reports ongoing nausea/vomiting and central chest pain after vomiting. Denies family history of cardiac disease or heart attacks.  Chest pain not worse with activity.  Pt asks to shower today.   Physical Exam: Vitals:   09/20/23 2047 09/21/23 0439 09/21/23 0828 09/21/23 1549  BP: (!) 155/97 (!) 123/90 122/87 126/86  Pulse: 78 78 74 76  Resp: 18 18 15 16   Temp: 98.9 F (37.2 C) 98 F (36.7 C) 98.2 F (36.8 C) 98.3 F (36.8 C)  TempSrc: Oral Oral Oral   SpO2: 100% 100% 99% 100%  Weight:  Height:       General exam: awake, alert, no acute distress, obese, ill-appearing HEENT: moist mucus membranes, hearing grossly normal  Respiratory system: CTAB, no wheezes, rales or rhonchi, normal respiratory effort. Cardiovascular system: normal S1/S2, RRR,no pedal edema.   Gastrointestinal system: soft, NT, ND, no HSM felt, +bowel sounds. Central nervous system: A&O x 3. no gross focal neurologic deficits, normal speech Extremities: moves all, no edema, normal  tone Skin: dry, intact, normal temperature Psychiatry: normal mood, congruent affect, judgement and insight appear normal   Data Reviewed:  Notable labs -- Na 134 Cr 0.7 >> 1.01 Gap 4 CBC normal   Family Communication: None present. Pt able to update.  Disposition: Status is: Inpatient Remains inpatient appropriate because: ongoing GI evaluation, persistent N/V   Planned Discharge Destination: Home    Time spent: 45 minutes  Author: Montey Apa, DO 09/21/2023 4:09 PM  For on call review [email or web address].

## 2023-09-21 NOTE — Progress Notes (Signed)
 Pt still with persistent nausea after PO trial despite premedication with reglan since zofran  seems to have little effect. Had another extensive discussion with pt and pt mother prior to PO trial about whether or not further workup can be completed while inpt. They initially declined GI Consult in person after discussion of wegovy as possible cause of symptoms and discussion of possible endoscopic intervention vs further outpt workup. Reassured them that despite limited oral intake, pt overall remains stable and no concerns for overt dehydration or malnutrition at this time  Labs still mostly normal, HIDA normal, so gallbladder etiology very unlikely. Stomach ulcer potentially an issue but we have initiated high dose PPI Without any significant change. After failure of PO trial, pt now requesting official GI consult. Discussed with hospitalist team and they are agreeable to take over as primary now as well.   Surgery to sign off. Please call with additional questions or concerns

## 2023-09-22 ENCOUNTER — Inpatient Hospital Stay: Admitting: Anesthesiology

## 2023-09-22 ENCOUNTER — Encounter: Payer: Self-pay | Admitting: Surgery

## 2023-09-22 ENCOUNTER — Encounter
Admission: AD | Disposition: A | Discharge status: Home / Self Care | Payer: Self-pay | Source: Ambulatory Visit | Attending: Surgery

## 2023-09-22 DIAGNOSIS — K449 Diaphragmatic hernia without obstruction or gangrene: Secondary | ICD-10-CM

## 2023-09-22 HISTORY — PX: ESOPHAGOGASTRODUODENOSCOPY: SHX5428

## 2023-09-22 LAB — COMPREHENSIVE METABOLIC PANEL WITH GFR
ALT: 65 U/L — ABNORMAL HIGH (ref 0–44)
AST: 31 U/L (ref 15–41)
Albumin: 3.3 g/dL — ABNORMAL LOW (ref 3.5–5.0)
Alkaline Phosphatase: 49 U/L (ref 38–126)
Anion gap: 4 — ABNORMAL LOW (ref 5–15)
BUN: 9 mg/dL (ref 6–20)
CO2: 25 mmol/L (ref 22–32)
Calcium: 8.3 mg/dL — ABNORMAL LOW (ref 8.9–10.3)
Chloride: 103 mmol/L (ref 98–111)
Creatinine, Ser: 0.93 mg/dL (ref 0.44–1.00)
GFR, Estimated: >60 mL/min (ref >60)
Glucose, Bld: 74 mg/dL (ref 70–99)
Potassium: 3.1 mmol/L — ABNORMAL LOW (ref 3.5–5.1)
Sodium: 132 mmol/L — ABNORMAL LOW (ref 135–145)
Total Bilirubin: 0.8 mg/dL (ref 0.0–1.2)
Total Protein: 6.5 g/dL (ref 6.5–8.1)

## 2023-09-22 LAB — POCT PREGNANCY, URINE: Preg Test, Ur: NEGATIVE

## 2023-09-22 SURGERY — EGD (ESOPHAGOGASTRODUODENOSCOPY)
Anesthesia: General

## 2023-09-22 MED ORDER — LIDOCAINE HCL (CARDIAC) PF 100 MG/5ML IV SOSY
PREFILLED_SYRINGE | INTRAVENOUS | Status: DC | PRN
  Administered 2023-09-22: 70 mg via INTRAVENOUS

## 2023-09-22 MED ORDER — POTASSIUM CHLORIDE CRYS ER 20 MEQ PO TBCR: 40.0000 meq | EXTENDED_RELEASE_TABLET | Freq: Four times a day (QID) | ORAL | Status: DC

## 2023-09-22 MED ORDER — PROPOFOL 500 MG/50ML IV EMUL
INTRAVENOUS | Status: DC | PRN
  Administered 2023-09-22: 150 ug/kg/min via INTRAVENOUS

## 2023-09-22 MED ORDER — MIDAZOLAM HCL 2 MG/2ML IJ SOLN
INTRAMUSCULAR | Status: AC
Start: 2023-09-22 — End: ?
  Filled 2023-09-22: qty 2

## 2023-09-22 MED ORDER — POTASSIUM CHLORIDE 10 MEQ/100ML IV SOLN: 10.0000 meq | INTRAVENOUS | Status: AC

## 2023-09-22 MED ORDER — PROPOFOL 10 MG/ML IV BOLUS
INTRAVENOUS | Status: DC | PRN
  Administered 2023-09-22: 60 mg via INTRAVENOUS
  Administered 2023-09-22: 40 mg via INTRAVENOUS

## 2023-09-22 MED ORDER — LIDOCAINE HCL (PF) 2 % IJ SOLN
INTRAMUSCULAR | Status: AC
  Filled 2023-09-22: qty 5

## 2023-09-22 MED ORDER — SODIUM CHLORIDE 0.9 % IV SOLN: INTRAVENOUS | Status: DC

## 2023-09-22 MED ORDER — MIDAZOLAM HCL 5 MG/5ML IJ SOLN
INTRAMUSCULAR | Status: DC | PRN
Start: 2023-09-22 — End: 2023-09-22
  Administered 2023-09-22: 2 mg via INTRAVENOUS

## 2023-09-22 MED ORDER — POTASSIUM CHLORIDE CRYS ER 20 MEQ PO TBCR
40.0000 meq | EXTENDED_RELEASE_TABLET | Freq: Once | ORAL | Status: AC
  Administered 2023-09-22: 40 meq via ORAL
  Filled 2023-09-22: qty 2

## 2023-09-22 MED ORDER — POTASSIUM CHLORIDE 10 MEQ/100ML IV SOLN
10.0000 meq | INTRAVENOUS | Status: DC
Start: 2023-09-22 — End: 2023-09-22

## 2023-09-22 MED ORDER — LACTATED RINGERS IV SOLN: INTRAVENOUS | Status: DC

## 2023-09-22 MED ORDER — POTASSIUM CHLORIDE CRYS ER 20 MEQ PO TBCR: 40.0000 meq | EXTENDED_RELEASE_TABLET | Freq: Two times a day (BID) | ORAL | Status: DC

## 2023-09-22 NOTE — Anesthesia Postprocedure Evaluation (Addendum)
 Anesthesia Post Note  Patient: Ambreen N Garrabrant  Procedure(s) Performed: EGD (ESOPHAGOGASTRODUODENOSCOPY)  Patient location during evaluation: Endoscopy Anesthesia Type: General Level of consciousness: awake and alert Pain management: pain level controlled Vital Signs Assessment: post-procedure vital signs reviewed and stable Respiratory status: spontaneous breathing, nonlabored ventilation, respiratory function stable and patient connected to nasal cannula oxygen Cardiovascular status: blood pressure returned to baseline and stable Postop Assessment: no apparent nausea or vomiting Anesthetic complications: no   No notable events documented.   Last Vitals:  Vitals:   09/22/23 1220 09/22/23 1221  BP: (!) 135/107 (!) 135/107  Pulse:  (!) 101  Resp:  (!) 22  Temp: 36.4 C   SpO2:  96%    Last Pain:  Vitals:   09/22/23 1230  TempSrc:   PainSc: 0-No pain                 Portia Brittle Cathye Kreiter

## 2023-09-22 NOTE — Progress Notes (Signed)
 The patient had an EGD today with a small hiatal hernia but no sign of ulcerations reflux gastritis or other pathology that may cause the patient's nausea and vomiting.  She states that she is feeling better today.  The patient's medication was likely the cause of her symptoms.  Nothing further to do from a GI point of view.  I will sign off.  Please call if any further GI concerns or questions.  We would like to thank you for the opportunity to participate in the care of Diane Moss.

## 2023-09-22 NOTE — Op Note (Signed)
 [facility] Gastroenterology Patient Name: Diane Moss Procedure Date: 09/22/2023 11:50 AM MRN: [id number] Account #: [id number] Date of Birth: [birth year] Admit Type: Inpatient Age: 28 Room: [facility] ENDO ROOM 4 Gender: Female Note Status: Finalized Instrument Name: Upper Endoscope [id number] Procedure:             Upper GI endoscopy Indications:           Nausea with vomiting Providers:             Marnee Sink MD, MD Referring MD:          Conrado Delay MD, MD (Referring MD), Rex Castor,                         MD (Referring MD) Medicines:             Propofol  per Anesthesia Complications:         No immediate complications. Procedure:             Pre-Anesthesia Assessment:                        - Prior to the procedure, a History and Physical was                         performed, and patient medications and allergies were                         reviewed. The patient's tolerance of previous                         anesthesia was also reviewed. The risks and benefits                         of the procedure and the sedation options and risks                         were discussed with the patient. All questions were                         answered, and informed consent was obtained. Prior                         Anticoagulants: The patient has taken no anticoagulant                         or antiplatelet agents. ASA Grade Assessment: II - A                         patient with mild systemic disease. After reviewing                         the risks and benefits, the patient was deemed in                         satisfactory condition to undergo the procedure.                        After obtaining informed consent, the endoscope was  passed under direct vision. Throughout the procedure,                         the patient's blood pressure, pulse, and oxygen                         saturations were monitored continuously. The Endoscope                          was introduced through the mouth, and advanced to the                         second part of duodenum. The upper GI endoscopy was                         accomplished without difficulty. The patient tolerated                         the procedure well. Findings:      A small hiatal hernia was present.      The stomach was normal.      The examined duodenum was normal. Impression:            - Small hiatal hernia.                        - Normal stomach.                        - Normal examined duodenum.                        - No specimens collected. Recommendation:        - Return patient to hospital ward for ongoing care.                        - Resume previous diet.                        - Continue present medications. Procedure Code(s):     --- Professional ---                        [phone], Esophagogastroduodenoscopy, flexible,                         transoral; diagnostic, including collection of                         specimen(s) by brushing or washing, when performed                         (separate procedure) Diagnosis Code(s):     --- Professional ---                        R11.2, Nausea with vomiting, unspecified CPT copyright 2022 American Medical Association. All rights reserved. The codes documented in this report are preliminary and upon coder review may  be revised to meet current compliance requirements. Marnee Sink MD, MD 09/22/2023 12:16:26 PM This report has been signed electronically. Number of Addenda: 0 Note Initiated On: 09/22/2023 11:50  AM Estimated Blood Loss:  Estimated blood loss: none.      [facility]

## 2023-09-22 NOTE — Consult Note (Addendum)
 PHARMACY CONSULT NOTE - FOLLOW UP  Pharmacy Consult for Electrolyte Monitoring and Replacement   Recent Labs: Potassium (mmol/L)  Date Value  09/22/2023 3.1 (L)   Magnesium (mg/dL)  Date Value  16/02/9603 2.2   Calcium (mg/dL)  Date Value  54/01/8118 8.3 (L)   Albumin (g/dL)  Date Value  14/78/2956 3.3 (L)   Phosphorus (mg/dL)  Date Value  21/30/8657 2.9   Sodium (mmol/L)  Date Value  09/22/2023 132 (L)   Assessment: Diane Moss is a 28 y.o. female with past medical history of obesity, anxiety who is currently admitted on the surgical service for abdominal pain and current nausea and vomiting. Patient reports nausea and vomiting over the past 4 to 5 days. They've recently started a course of Wegovy. Took an increased dose last week with nausea and vomiting starting roughly within 4 to 5 hours after the increased dose. Pharmacy has been consulted to manage this patient's electrolytes.   Goal of Therapy:  Electrolytes WNL  Plan:  K = 3.1, provider ordered Kcl 10 mEq IV x 4 Continue to follow electrolytes with AM labs  Thank you for allowing pharmacy to participate in this patient's care.  Ramonita Burow, PharmD Clinical Pharmacist  09/22/2023 7:14 AM

## 2023-09-22 NOTE — Transfer of Care (Signed)
 Immediate Anesthesia Transfer of Care Note  Patient: Diane Moss  Procedure(s) Performed: EGD (ESOPHAGOGASTRODUODENOSCOPY)  Patient Location: PACU and Endoscopy Unit  Anesthesia Type:General  Level of Consciousness: drowsy  Airway & Oxygen Therapy: Patient Spontanous Breathing  Post-op Assessment: Report given to RN and Post -op Vital signs reviewed and stable  Post vital signs: Reviewed and stable DBP continues to be high  Last Vitals:  Vitals Value Taken Time  BP 135/107 09/22/23 1221  Temp    Pulse 102 09/22/23 1221  Resp 21 09/22/23 1221  SpO2 97 % 09/22/23 1221  Vitals shown include unfiled device data.  Last Pain:  Vitals:   09/22/23 1129  TempSrc: Temporal  PainSc: 0-No pain         Complications: No notable events documented.

## 2023-09-22 NOTE — TOC CM/SW Note (Signed)
 Transition of Care [facility]) - Inpatient Brief Assessment   Patient Details  Name: Diane Moss MRN: [id number] Date of Birth: Aug 04, 1995  [facility]) CM/SW Contact:    Loman Risk, RN Phone Number: 09/22/2023, 1:30 PM   Clinical Narrative:   Transition of Care [facility]) Screening Note   Patient Details  Name: Diane Moss Date of Birth: 07-29-1995   Transition of Care [facility]) CM/SW Contact:    Loman Risk, RN Phone Number: 09/22/2023, 1:30 PM    Transition of Care [facility]) has reviewed patient and no TOC needs have been identified at this time. If new patient transition needs arise, please place a TOC consult.    Transition of Care Asessment: Insurance and Status: Insurance coverage has been reviewed Patient has primary care physician: Yes     Prior/Current Home Services: No current home services Social Drivers of Health Review: SDOH reviewed no interventions necessary Readmission risk has been reviewed: Yes Transition of care needs: no transition of care needs at this time

## 2023-09-22 NOTE — Anesthesia Preprocedure Evaluation (Signed)
 Anesthesia Evaluation  Patient identified by MRN, date of birth, ID band Patient awake    Reviewed: Allergy & Precautions, NPO status , Patient's Chart, lab work & pertinent test results  History of Anesthesia Complications Negative for: history of anesthetic complications  Airway Mallampati: III  TM Distance: <3 FB Neck ROM: full    Dental  (+) Chipped   Pulmonary neg pulmonary ROS, neg shortness of breath   Pulmonary exam normal        Cardiovascular Exercise Tolerance: Good (-) angina negative cardio ROS Normal cardiovascular exam     Neuro/Psych  Headaches PSYCHIATRIC DISORDERS         GI/Hepatic negative GI ROS, Neg liver ROS,neg GERD  ,,  Endo/Other  negative endocrine ROS    Renal/GU negative Renal ROS  negative genitourinary   Musculoskeletal   Abdominal   Peds  Hematology negative hematology ROS (+)   Anesthesia Other Findings Patient is NPO appropriate and reports no nausea or vomiting today.  Past Medical History: No date: Acne No date: Anxiety No date: Headache(784.0) No date: Obesity  Past Surgical History: 08/17/2018: EXTRACORPOREAL SHOCK WAVE LITHOTRIPSY; Left     Comment:  Procedure: EXTRACORPOREAL SHOCK WAVE LITHOTRIPSY (ESWL);              Surgeon: Dustin Gimenez, MD;  Location: [facility] ORS;                Service: Urology;  Laterality: Left;  BMI    Body Mass Index: 38.43 kg/m      Reproductive/Obstetrics negative OB ROS                             Anesthesia Physical Anesthesia Plan  ASA: 2  Anesthesia Plan: General   Post-op Pain Management:    Induction: Intravenous  PONV Risk Score and Plan: Propofol  infusion and TIVA  Airway Management Planned: Natural Airway and Nasal Cannula  Additional Equipment:   Intra-op Plan:   Post-operative Plan:   Informed Consent: I have reviewed the patients History and Physical, chart, labs and discussed the  procedure including the risks, benefits and alternatives for the proposed anesthesia with the patient or authorized representative who has indicated his/her understanding and acceptance.     Dental Advisory Given  Plan Discussed with: Anesthesiologist, CRNA and Surgeon  Anesthesia Plan Comments: (Patient consented for risks of anesthesia including but not limited to:  - adverse reactions to medications - risk of airway placement if required - damage to eyes, teeth, lips or other oral mucosa - nerve damage due to positioning  - sore throat or hoarseness - Damage to heart, brain, nerves, lungs, other parts of body or loss of life  Patient voiced understanding and assent.)       Anesthesia Quick Evaluation

## 2023-09-22 NOTE — Discharge Summary (Signed)
 Physician Discharge Summary   Patient: Diane Moss MRN: [id number] DOB: 09-10-1995  Admit date:     09/19/2023  Discharge date: 09/22/2023  Discharge Physician: Montey Apa   PCP: Rex Castor, MD   Recommendations at discharge:    Follow up with Primary Care in 1-2 weeks Repeat CBC, CMP, Mg at follow up Recommend consideration of alternative GLP-1 agent or continuing current medication at lower, tolerated, dose given N/V onset just after initial higher dose  Discharge Diagnoses: Principal Problem:   Acute cholecystitis Active Problems:   Nausea and vomiting   Contraception management   Cholecystitis   Hypokalemia  Resolved Problems:   * No resolved hospital problems. [facility] Course:  HPI from 4/29: "Diane Moss is a 28 y.o. female with past medical history of obesity, anxiety who is currently admitted on the surgical service for abdominal pain and current nausea and vomiting.  Also requested were made by Dr. Rosea Conch for nausea and and vomiting.  Patient reports nausea and vomiting over the past 4 to 5 days.  Also with significant abdominal pain.  Patient states that she has recently started a course of Wegovy.  Took an increased dose last week with nausea and vomiting starting roughly within 4 to 5 hours of increased dose.  Also with abdominal pain.  Pain seems to be more predominant in the epigastric region.  Has had some episodes of vomiting.  Nonbilious nonbloody.  No reported diarrhea.  No chest pain or shortness of breath.  No focal hemiparesis or confusion.  Has had some epigastric as well as reflux-like symptoms.  Denies any recent NSAID use.  Denies any known history of GERD in the past.  No reported alcohol use.  No reported illicit drug use.  Has had fairly complete surgical evaluation including HIDA scan that was negative for cholecystitis.  Had abdominal ultrasound which showed cholelithiasis and gallbladder sludge with some?  Biliary colic. Currently  afebrile, hemodynamically stable.  Satting well on room air.  White count 12.6, hemoglobin 12.9, platelets 336.  AST 39, ALT 68, T. bili 1.  Creatinine 0.76.  Potassium 3.3.  Sodium 130."   Patient was initially admitted to General Surgery's service and medicine was consulted.  After cholecystitis was ruled out including negative HIDA scan, Surgery requested Hospitalist service assume care as primary.    I assumed care 09/21/23. Patient having ongoing nausea/vomiting.   GI was consulted and planned for EGD which was performed on 09/22/23 and normal.  5/1 -- pt feels better today, N/V has resolved. Tolerating diet well post-EGD today.  Medically stable and requesting dsicharge home.       Assessment and Plan:  Nausea and vomiting - intractable over ~ 1 week prior to admission Status post recent full surgical workup for biliary etiology with normal HIDA scan T. bili and LFTs are stable Suspect gastritis vs viral illness vs increased GLP-1 dose given timing of symptom onset correlating with the day dose was increased. --GI consulted -- EGD today was normal --Diet resumed - pt tolerating --Treated with IV PPI and IV fluids --Electrolytes were monitored and replaced     Atypical chest pain - Resolved. Suspect esophageal irritation from N/V, unlikely cardiac.  D-dimer was obtained and negative, rules out PE. Pain is non-exertional.  No family hx of cardiac disease in young.  Given nonspecific chest pain in the setting of OCP use, I will also   Troponin was normal. --Trial GI cocktail  Hypokalemia - replaced & resolved. --Monitor BMP, replace K as needed   Contraception management --On Cyclafem chronically         Consultants: Surgery, GI Procedures performed: EGD  Disposition: Home Diet recommendation:  Regular diet DISCHARGE MEDICATION: Allergies as of 09/22/2023       Reactions   Peach Flavoring Agent (non-screening) Swelling, Rash        Medication List      STOP taking these medications    promethazine  12.5 MG tablet Commonly known as: PHENERGAN    Wegovy 1.7 MG/0.75ML Soaj Generic drug: Semaglutide-Weight Management       TAKE these medications    buPROPion  150 MG 24 hr tablet Commonly known as: WELLBUTRIN  XL Take 150 mg by mouth daily.   escitalopram  20 MG tablet Commonly known as: LEXAPRO  Take 20 mg by mouth daily.   levothyroxine  75 MCG tablet Commonly known as: SYNTHROID  Take 75 mcg by mouth daily before breakfast.   Magnesium Oxide 250 MG Tabs Take 250 mg by mouth 2 (two) times daily.   metoCLOPramide  5 MG tablet Commonly known as: Reglan  Take 1 tablet (5 mg total) by mouth every 8 (eight) hours as needed for nausea or vomiting.   Nortrel  7/7/7 0.5/0.75/1-35 MG-MCG tablet Generic drug: norethindrone -ethinyl estradiol  TAKE 1 TABLET BY MOUTH EVERY DAY   nortriptyline  10 MG capsule Commonly known as: PAMELOR  Take 20-30 mg by mouth at bedtime.   ondansetron  4 MG disintegrating tablet Commonly known as: ZOFRAN -ODT Take 1 tablet (4 mg total) by mouth every 6 (six) hours as needed for nausea. What changed:  when to take this reasons to take this   pantoprazole  40 MG tablet Commonly known as: Protonix  Take 1 tablet (40 mg total) by mouth 2 (two) times daily.        Follow-up Information     Rex Castor, MD Follow up.   Specialty: Internal Medicine Why: As needed, If symptoms worsen Contact information: [address] [place] [address] [phone]                Discharge Exam: Diane Moss Weights   09/19/23 1851  Weight: 108 kg   General exam: awake, alert, no acute distress HEENT: atraumatic, clear conjunctiva, anicteric sclera, moist mucus membranes, hearing grossly normal  Respiratory system: CTAB, no wheezes, rales or rhonchi, normal respiratory effort. Cardiovascular system: normal S1/S2, RRR, no JVD, murmurs, rubs, gallops, no pedal edema.   Gastrointestinal system:  soft, NT, ND, no HSM felt, +bowel sounds. Central nervous system: A&O x4. no gross focal neurologic deficits, normal speech Extremities: moves all , no edema, normal tone Skin: dry, intact, normal temperature, normal color, No rashes, lesions or ulcers Psychiatry: normal mood, congruent affect, judgement and insight appear normal   Condition at discharge: stable  The results of significant diagnostics from this hospitalization (including imaging, microbiology, ancillary and laboratory) are listed below for reference.   Imaging Studies: NM Hepato W/EF Result Date: 09/20/2023 CLINICAL DATA:  RIGHT upper quadrant pain. Cholelithiasis on cross-sectional imaging. EXAM: NUCLEAR MEDICINE HEPATOBILIARY IMAGING WITH GALLBLADDER EF TECHNIQUE: Sequential images of the abdomen were obtained out to 60 minutes following intravenous administration of radiopharmaceutical. After oral ingestion of Ensure, gallbladder ejection fraction was determined. At 60 min, normal ejection fraction is greater than 33%. RADIOPHARMACEUTICALS:  5.0 mCi Tc-56m  Choletec  IV COMPARISON:  Ultrasound and CT 09/16/2018 FINDINGS: Prompt clearance radiotracer from blood pool and homogeneous uptake in liver. Counts are evident in the common bile duct and small bowel by 30  minutes. The gallbladder begins to fill at 60 minutes. At 85 minutes gallbladder is more distinctive. Liquid ensure was administered to stimulate contracted gallbladder. Patient vomited shortly after drinking Ensure. Gallbladder ejection fraction cannot be calculated IMPRESSION: 1. Patent cystic duct and common bile duct. No evidence acute cholecystitis. 2. Gallbladder ejection fraction cannot be calculated as patient vomited the fatty meal. Electronically Signed   By: Deboraha Fallow M.D.   On: 09/20/2023 12:02   US  Abdomen Limited RUQ (LIVER/GB) Result Date: 09/16/2023 CLINICAL DATA:  [id number] Epigastric abdominal pain [id number] EXAM: ULTRASOUND ABDOMEN LIMITED RIGHT UPPER  QUADRANT COMPARISON:  CT abdomen pelvis 09/16/2023 FINDINGS: Gallbladder: Multiple mobile calcified gallstones and gallbladder sludge within the gallbladder lumen. No gallbladder wall thickening or pericholecystic fluid visualized. Unable to evaluate for sonographic Murphy sign given premedication. Common bile duct: Diameter: 4 mm Liver: No focal lesion identified. Increased parenchymal echogenicity. Portal vein is patent on color Doppler imaging with normal direction of blood flow towards the liver. Other: None. IMPRESSION: 1. Cholelithiasis and gallbladder sludge with no acute cholecystitis. 2. Hepatic steatosis. Please note limited evaluation for focal hepatic masses in a patient with hepatic steatosis due to decreased penetration of the acoustic ultrasound waves. Electronically Signed   By: Morgane  Naveau M.D.   On: 09/16/2023 23:36   CT ABDOMEN PELVIS W CONTRAST Result Date: 09/16/2023 CLINICAL DATA:  Epigastric pain and vomiting. EXAM: CT ABDOMEN AND PELVIS WITH CONTRAST TECHNIQUE: Multidetector CT imaging of the abdomen and pelvis was performed using the standard protocol following bolus administration of intravenous contrast. RADIATION DOSE REDUCTION: This exam was performed according to the departmental dose-optimization program which includes automated exposure control, adjustment of the mA and/or kV according to patient size and/or use of iterative reconstruction technique. CONTRAST:  OMNIPAQUE  IOHEXOL  350 MG/ML SOLN COMPARISON:  February 14, 2019 FINDINGS: Lower chest: No acute abnormality. Hepatobiliary: There is diffuse fatty infiltration of the liver parenchyma. No focal liver abnormality is seen. Numerous subcentimeter low-attenuation gallstones are seen without evidence of gallbladder wall thickening, pericholecystic inflammation or biliary dilatation. Pancreas: Unremarkable. No pancreatic ductal dilatation or surrounding inflammatory changes. Spleen: Normal in size without focal  abnormality. Adrenals/Urinary Tract: Adrenal glands are unremarkable. Kidneys are normal in size, without renal calculi or hydronephrosis. A 7 mm diameter simple cyst is seen within the left kidney. Bladder is unremarkable. Stomach/Bowel: There is a small hiatal hernia. Appendix appears normal. No evidence of bowel wall thickening, distention, or inflammatory changes. Noninflamed diverticula are seen throughout the large bowel. Vascular/Lymphatic: No significant vascular findings are present. No enlarged abdominal or pelvic lymph nodes. Reproductive: Uterus and bilateral adnexa are unremarkable. Other: No abdominal wall hernia or abnormality. No abdominopelvic ascites. Musculoskeletal: No acute or significant osseous findings. IMPRESSION: 1. Cholelithiasis. 2. Small hiatal hernia. 3. Colonic diverticulosis. 4. Hepatic steatosis. 5. Subcentimeter simple left renal cyst. No follow-up imaging is recommended. This recommendation follows ACR consensus guidelines: Management of the Incidental Renal Mass on CT: A White Paper of the ACR Incidental Findings Committee. J Am Coll Radiol [phone]. Electronically Signed   By: Virgle Grime M.D.   On: 09/16/2023 03:15   DG Chest 2 View Result Date: 09/15/2023 CLINICAL DATA:  CP EXAM: CHEST - 2 VIEW COMPARISON:  02/25/2017. FINDINGS: Cardiac silhouette is unremarkable. No pneumothorax or pleural effusion. The lungs are clear. The visualized skeletal structures are unremarkable. IMPRESSION: No acute cardiopulmonary process. Electronically Signed   By: Sydell Eva M.D.   On: 09/15/2023 23:43    Microbiology: Results for orders  placed or performed in visit on 03/10/22  Microscopic Examination     Status: Abnormal   Collection Time: 03/10/22  3:24 PM   Urine  Result Value Ref Range Status   WBC, UA 0-5 0 - 5 /hpf Final   RBC, Urine 0-2 0 - 2 /hpf Final   Epithelial Cells (non renal) 0-10 0 - 10 /hpf Final   Bacteria, UA Moderate (A) None seen/Few Final     Labs: CBC: No results for input(s): "WBC", "NEUTROABS", "HGB", "HCT", "MCV", "PLT" in the last 168 hours.  Basic Metabolic Panel: No results for input(s): "NA", "K", "CL", "CO2", "GLUCOSE", "BUN", "CREATININE", "CALCIUM", "MG", "PHOS" in the last 168 hours.  Liver Function Tests: No results for input(s): "AST", "ALT", "ALKPHOS", "BILITOT", "PROT", "ALBUMIN" in the last 168 hours.  CBG: No results for input(s): "GLUCAP" in the last 168 hours.   Discharge time spent: less than 30 minutes.  Signed: Montey Apa, DO [facility] 09/30/2023
